# Patient Record
Sex: Female | Born: 1975 | Race: White | Hispanic: No | Marital: Married | Smoking: Never smoker
Health system: Southern US, Community
[De-identification: ages and names within clinical notes are randomized; demographics above are authoritative.]

## PROBLEM LIST (undated history)

## (undated) DIAGNOSIS — F419 Anxiety disorder, unspecified: Secondary | ICD-10-CM

## (undated) DIAGNOSIS — R7989 Other specified abnormal findings of blood chemistry: Secondary | ICD-10-CM

## (undated) DIAGNOSIS — E781 Pure hyperglyceridemia: Secondary | ICD-10-CM

## (undated) DIAGNOSIS — F32A Depression, unspecified: Secondary | ICD-10-CM

## (undated) DIAGNOSIS — F329 Major depressive disorder, single episode, unspecified: Secondary | ICD-10-CM

## (undated) HISTORY — DX: Depression, unspecified: F32.A

## (undated) HISTORY — DX: Pure hyperglyceridemia: E78.1

## (undated) HISTORY — DX: Other specified abnormal findings of blood chemistry: R79.89

## (undated) HISTORY — DX: Anxiety disorder, unspecified: F41.9

## (undated) HISTORY — DX: Major depressive disorder, single episode, unspecified: F32.9

---

## 2003-08-10 ENCOUNTER — Other Ambulatory Visit: Admission: RE | Admit: 2003-08-10 | Discharge: 2003-08-10 | Payer: Self-pay | Admitting: Obstetrics and Gynecology

## 2004-08-14 ENCOUNTER — Other Ambulatory Visit: Admission: RE | Admit: 2004-08-14 | Discharge: 2004-08-14 | Payer: Self-pay | Admitting: Obstetrics and Gynecology

## 2006-01-09 ENCOUNTER — Other Ambulatory Visit: Admission: RE | Admit: 2006-01-09 | Discharge: 2006-01-09 | Payer: Self-pay | Admitting: Obstetrics and Gynecology

## 2006-02-19 ENCOUNTER — Inpatient Hospital Stay (HOSPITAL_COMMUNITY): Admission: AD | Admit: 2006-02-19 | Discharge: 2006-02-23 | Payer: Self-pay | Admitting: Obstetrics and Gynecology

## 2006-02-26 ENCOUNTER — Inpatient Hospital Stay (HOSPITAL_COMMUNITY): Admission: AD | Admit: 2006-02-26 | Discharge: 2006-02-27 | Payer: Self-pay | Admitting: Obstetrics and Gynecology

## 2006-03-01 ENCOUNTER — Inpatient Hospital Stay (HOSPITAL_COMMUNITY): Admission: AD | Admit: 2006-03-01 | Discharge: 2006-03-01 | Payer: Self-pay | Admitting: Obstetrics and Gynecology

## 2008-10-27 ENCOUNTER — Inpatient Hospital Stay (HOSPITAL_COMMUNITY): Admission: AD | Admit: 2008-10-27 | Discharge: 2008-10-27 | Payer: Self-pay | Admitting: Obstetrics and Gynecology

## 2008-10-29 ENCOUNTER — Inpatient Hospital Stay (HOSPITAL_COMMUNITY): Admission: AD | Admit: 2008-10-29 | Discharge: 2008-11-01 | Payer: Self-pay | Admitting: Obstetrics and Gynecology

## 2010-12-18 LAB — CBC
HCT: 38.7 % (ref 36.0–46.0)
Hemoglobin: 12.9 g/dL (ref 12.0–15.0)
MCHC: 33.1 g/dL (ref 30.0–36.0)
MCHC: 33.2 g/dL (ref 30.0–36.0)
MCV: 95.1 fL (ref 78.0–100.0)
Platelets: 149 10*3/uL — ABNORMAL LOW (ref 150–400)
RDW: 14.1 % (ref 11.5–15.5)
WBC: 12 10*3/uL — ABNORMAL HIGH (ref 4.0–10.5)

## 2011-01-15 NOTE — Op Note (Signed)
Suzanne Rios, SIMAO               ACCOUNT NO.:  [id number]   MEDICAL RECORD NO.:  [id number]          PATIENT TYPE:  INP   LOCATION:  9148                          FACILITY:  [facility]   PHYSICIAN:  Duke Salvia. Marcelle Overlie, M.D.DATE OF BIRTH:  22-Apr-1976   DATE OF PROCEDURE:  DATE OF DISCHARGE:                               OPERATIVE REPORT   PREOPERATIVE DIAGNOSES:  Repeat cesarean section, spontaneous rupture of  membranes with labor, declines vaginal birth after cesarean.   POSTOPERATIVE DIAGNOSES:  Repeat cesarean section, spontaneous rupture  of membranes with labor, declines vaginal birth after cesarean, and  occiput posterior presentation.   PROCEDURE:  Repeat low transverse cesarean section.   SURGEON:  Duke Salvia. Marcelle Overlie, MD   ANESTHESIA:  Spinal.   COMPLICATIONS:  None.   DRAINS:  Foley catheter.   BLOOD LOSS:  700 mL.   PROCEDURE AND FINDINGS:  The patient was taken to the operating room.  After an adequate level of spinal anesthetic was obtained with the  patient in left tilt position, the abdomen was prepped and draped in the  usual manner for sterile abdominal procedures.  Foley catheter  positioned draining clear urine.  Transverse incision was made excising  the old scar in an ellipse.  This was carried down to the fascia, which  was incised and extended transversely.  Rectus muscle was divided in the  midline.  Peritoneum entered superiorly without incident and extended  vertically.  Bladder blade was positioned.  The vesicouterine serosa was  then incised.  The bladder flap was dissected below with sharp and blunt  dissection, and the bladder blade repositioned.  Transverse incision was  made in the lower segment, extended with blunt dissection.  The infant  was noted to be straight OP presentation, Apgars 9 and 9, weight 8  pounds 11 ounces.  A superficial nick on the baby's right cheek was made  with a scalpel on entry and was pointed out to pediatrics.  The  placenta  was delivered manually intact, the uterus exteriorized, cavity wiped  clean with a laparotomy pack, closure obtained with a first layer of 0  chromic in a locked fashion followed by an imbricating layer of 0  chromic.  Tubes and ovaries were normal.  The bladder flap area was  carefully inspected and noted to be intact and hemostatic.  Prior to  closure, sponge, needle, and instrument counts were reported as correct  x2.  Perineum was closed with a running 2-0 Vicryl suture.  Rectus  muscles reapproximated with 2-0 Vicryl interrupted sutures.  Fascia was  closed from laterally to midline on either side with 0 PDS suture.  Subcutaneous tissue was hemostatic after irrigation.  The skin closed  with staples and a sterile pressure dressing.  Clear urine was noted at  the end of the case.  She did receive Ancef 2 g IV preop and Pitocin  after the cord was clamped.  Mother and baby doing well at that point.      Richard M. Marcelle Overlie, M.D.  Electronically Signed     RMH/MEDQ  D:  10/29/2008  T:  10/29/2008  Job:  [id number]

## 2011-01-18 NOTE — Discharge Summary (Signed)
NAMEJORDY, Suzanne Rios               ACCOUNT NO.:  [id number]   MEDICAL RECORD NO.:  [id number]          PATIENT TYPE:  INP   LOCATION:  9148                          [facility]ACILITY:  [facility]   PHYSICIAN:  Dineen Kid. Rana Snare, M.D.    DATE OF BIRTH:  [birth year]   DATE OF ADMISSION:  10/29/2008  DATE OF DISCHARGE:  11/01/2008                               DISCHARGE SUMMARY   ADMITTING DIAGNOSES:  1. Intrauterine pregnancy at term.  2. Previous cesarean section, desires repeat.  3. Spontaneous rupture of membranes.   DISCHARGE DIAGNOSES:  1. Status post low transverse cesarean section.  2. Viable female infant.   PROCEDURE:  Repeat low transverse cesarean section.   REASON FOR ADMISSION:  Please see written H&P.   HOSPITAL COURSE:  The patient was a 35 year old gravida 2, para 1 that  was admitted to [facility] with spontaneous rupture of  membranes.  The patient was at term and had previously been scheduled  for cesarean delivery.  On admission, the patient was counseled  regarding possible vaginal birth after cesarean.  The patient declined.  Vital signs were stable with blood pressure 120/78.  Cervix was closed  with positive rupture of membranes.  The patient was then transferred to  the operating room where spinal anesthesia was administered without  difficulty.  A low-transverse incision was made with delivery of a  viable female infant weighing 8 pounds 11 ounces with Apgars of 8 at 1  minute and 9 at 5 minutes.  The patient tolerated the procedure well and  was taken to the recovery room in stable condition.  On postoperative  day #1, the patient was without complaints.  Vital signs were stable.  She was afebrile.  Abdomen was soft.  Fundus was firm and nontender.  Hemoglobin was noted to be 12.3.  On postoperative day #2, the patient  was without complaint.  Vital signs remained stable.  She was afebrile.  Abdomen was soft.  Fundus firm and nontender.  Incision was clean,  dry,  and intact.  On postoperative day #3, the patient stated that she did  not feel well.  She was achy with a productive cough.  Vital signs were  stable.  She was afebrile.  Lungs were clear to auscultation.  Abdomen  was soft.  Fundus was firm and nontender.  Incision was noted to have  some slight erythema noted superior to the incisional site with some  small oozing noted on the left margin of the incision.  Staples were  left in place.  The patient was started on some Ceftin 250 mg 1 p.o.  b.i.d. and was started on some Mucinex cough syrup later that afternoon.  The patient was feeling better and decided that she desired discharge.  Instructions were reviewed and the patient was later discharged home.   CONDITION ON DISCHARGE:  Stable.   DIET:  Regular as tolerated.   ACTIVITY:  No heavy lifting, no driving x2 weeks, no vaginal entry.   FOLLOWUP:  The patient to follow up in the office in 1 week for an  incision  check.  She is to call for temperature greater than 100  degrees, persistent nausea, vomiting, heavy vaginal bleeding, and/or  redness or further drainage from incisional site.   DISCHARGE MEDICATIONS:  1. Ceftin 250 mg 1 p.o. b.i.d. x10 days.  2. Mucinex cough syrup as directed.  3. Prenatal vitamins 1 p.o. daily.  4. Tylox #30 one p.o. every 4 hours as needed for pain.  5. Motrin 600 mg every 6 hours p.r.n.      [name], N.P.      Dineen Kid Rana Snare, M.D.  Electronically Signed    CC/MEDQ  D:  11/18/2008  T:  11/18/2008  Job:  [id number]

## 2011-01-18 NOTE — H&P (Signed)
NAMECOSETTE, PRINDLE               ACCOUNT NO.:  [id number]   MEDICAL RECORD NO.:  [id number]          PATIENT TYPE:  INP   LOCATION:  9173                          [facility]ILITY:  [facility]   PHYSICIAN:  Hal Morales, M.D.DATE OF BIRTH:  12-May-1976   DATE OF ADMISSION:  02/19/2006  DATE OF DISCHARGE:                                HISTORY & PHYSICAL   Ms. Suzanne Rios is a 35 year old gravida 1, para 0, at 41 weeks who presents for  induction secondary to post dates.   Dictation ends at this point.      Renaldo Reel Emilee Hero, C.N.M.      Hal Morales, M.D.  Electronically Signed    VLL/MEDQ  D:  02/19/2006  T:  02/19/2006  Job:  [id number]

## 2011-01-18 NOTE — H&P (Signed)
NAMEPATRICIA, Rios               ACCOUNT NO.:  [id number]   MEDICAL RECORD NO.:  [id number]          PATIENT TYPE:  INP   LOCATION:  9173                          [facility]   PHYSICIAN:  Hal Morales, M.D.DATE OF BIRTH:  31-Mar-1976   DATE OF ADMISSION:  02/19/2006  DATE OF DISCHARGE:                                HISTORY & PHYSICAL   Suzanne Rios is a 35 year old, gravida 1, para 0, at 41 weeks, who presents  today for induction secondary to post-dates.  Her pregnancy has been  remarkable for:   1.  History of depression and anxiety with patient well controlled on      Lexapro during her pregnancy.  2.  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   PRENATAL LABS:  Blood type is A+, RH antibody negative, VDRL nonreactive,  rubella titer positive, hepatitis B surface antigen negative, HIV  nonreactive, cystic fibrosis testing was negative.  Cultures were declined.  Pap was due after December.  This was done at 36 weeks and was normal.  Quadruple screen was declined.  Group B strep culture was negative at 36  weeks.  Glucola was normal at 120.  EDC of February 13, 2006, was established by  last menstrual period and was in agreement with ultrasound at approximately  12 and 18 weeks.   HISTORY OF PRESENT PREGNANCY:  Patient entered care at approximately 10  weeks.  She did have a nuchal translucency ultrasound at 12 weeks that was  within normal limits.  She declined quadruple screen.  She had another  ultrasound at 19 weeks, showing normal growth and development with placenta  anterior.  She was having some pain in her left side at 27 weeks.  This was  evaluated and found to be unremarkable.  Glucola was normal.  She was  continued with Lexapro 20 mg, during her pregnancy, q. day.  Her Pap and  group B strep culture was done at 36 weeks.  Pap was normal and group B  strep culture was negative.  She had an ultrasound at 37 weeks, showing  growth at  the 87th percentile, fluid was at the 95th percentile, fetus was  in a vertex presentation.  She was evaluated at 37 weeks for questionable  cramping.  Cervix was normal.  She was seen in the office on Monday of this  week.  Cervix was 1 cm, 50% with a vertex at a -1 station, slightly  posterior.  She was scheduled for induction secondary to post-dates.   OBSTETRICAL HISTORY:  Patient is a prima gravida.   MEDICAL HISTORY:  She was on Ortho Tri-Cyclen for 4 years, Yasmin for 3  years.  She had an abnormal Pap in 2005, but repeat was normal.  She reports  usual childhood illnesses.  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  She was  not treated, but x-rays have all been clear.  The last one was last year.  She had a couple of UTIs years ago.  Patient has depression and anxiety for  which she has been  on Lexapro with good control.   SURGICAL HISTORY:  She had wisdom teeth extracted in August 2006.  PATIENT  HAS NO KNOWN MEDICATION ALLERGIES.   FAMILY HISTORY:  Her maternal grandmother died of lung cancer.  Her paternal  grandfather died of colon cancer.  Genetic history is remarkable for the  father of the baby's maternal aunt and father of baby's paternal first  cousin having twins.   SOCIAL HISTORY:  Patient is married to the father of the baby.  He is  involved and supportive.  His name is J. Tereasa Coop.  The patient is Caucasian.  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  Her husband has a graduate degree  and is a Teacher, early years/pre.  She has been followed by the certified nurse midwife  service __________ OB.  She denies any alcohol, drug or tobacco use during  this pregnancy.   PHYSICAL EXAMINATION:  Vital signs are stable.  Patient is afebrile.  HEENT  is within normal limits.  Lungs were essentially clear.  Heart was regular  rate and rhythm without murmur.  Breasts were soft and nontender.  ABDOMEN:  Fundal height is approximately 39 cm.  Estimated fetal weight is 8  pounds.  Uterine  contractions are very 6 minutes, mild quality.  CERVICAL EXAM:  1 cm, slightly posterior, 70%, vertex and -1 station.  Fetal  heart rate is reactive with no decelerations.  EXTREMITIES:  Deep tendon reflexes are 2+ without clonus.  There is a trace  edema noted.   IMPRESSION:  1.  Intrauterine pregnancy at 41 weeks.  2.  Negative beta strep.  3.  Post-dates.   PLAN:  1.  Admit to birthing suite for consult with Dr. Dierdre Forth as      attending physician.  2.  Routine certified nurse midwife orders.  3.  Will plan Cervidil placement and will reevaluate in the morning for plan      of care regarding leaving the Cervidil in for a full 12 hours or      removing it in the morning and starting Pitocin.  4.  Risks and benefits of induction were reviewed with the patient,      including failure of method, prolonged labor, and need for cesarean      section.  Patient and her husband seem to understand these risks, and do      wish to proceed.      Renaldo Reel Emilee Hero, C.N.M.      Hal Morales, M.D.  Electronically Signed    VLL/MEDQ  D:  02/19/2006  T:  02/20/2006  Job:  [id number]

## 2011-01-18 NOTE — Discharge Summary (Signed)
NAMELILLYONA, Suzanne Rios               ACCOUNT NO.:  [id number]   MEDICAL RECORD NO.:  [id number]          PATIENT TYPE:  INP   LOCATION:  9108                          [facility]  [facility]   PHYSICIAN:  Osborn Coho, M.D.   DATE OF BIRTH:  03-25-1976   DATE OF ADMISSION:  02/19/2006  DATE OF DISCHARGE:  02/23/2006                                 DISCHARGE SUMMARY   ADMITTING DIAGNOSES:  1.  Intrauterine pregnancy at term.  2.  Failure to progress.   PROCEDURE:  Primary low transverse cesarean section.   POSTOPERATIVE DIAGNOSES:  1.  Intrauterine pregnancy at term, delivered.  2.  Failure to progress.  3.  Primary low transverse cesarean section.   Suzanne Rios is a 35 year old gravida 1, para 0, at 41 weeks, who presented  for induction of labor. She progressed to complete and pushed for 2-1/2  hours with no further descend. Dr. Normand Sloop assessed the patient and  discussed options with the patient and husband regarding plan for delivery.  The patient opted for primary low transverse cesarean section.  This was  accomplished on February 21, 2006, by Dr. Jaymes Graff with the birth of an 8  pound 3 ounce female infant with Apgar scores of 9 at one 1 minute and 9 at  five minutes. Both the patient and infant have done well in the  postoperative period. The patient's vital signs have been stable.  She is  afebrile.  Her hemoglobin on the first postoperative day at 10.8. On this,  her third postoperative day, her incision is clean, dry and intact.  Her  Jackson-Pratt drain was removed easily without any problems.  On this, her  third postoperative day, she is judged to be in satisfactory condition for  discharge.  Discharge instructions per [facility] handout.   DISCHARGE MEDICATIONS:  1.  Motrin 600 mg p.o. q.6 h p.r.n. pain.  2.  Tylox one to two p.o. q. 3-4 hours p.r.n. pain.  3.  Prenatal vitamins.   DISCHARGE FOLLOWUP:  Will be in [facility] in 6 weeks.      Rica Koyanagi, C.N.M.      Osborn Coho, M.D.  Electronically Signed   SDM/MEDQ  D:  02/23/2006  T:  02/23/2006  Job:  [id number]

## 2011-01-18 NOTE — Op Note (Signed)
Suzanne Rios, Suzanne Rios               ACCOUNT NO.:  [id number]   MEDICAL RECORD NO.:  [id number]          PATIENT TYPE:  INP   LOCATION:  9108                          [facility]   PHYSICIAN:  Naima A. Dillard, M.D. DATE OF BIRTH:  Nov 25, 1975   DATE OF PROCEDURE:  02/21/2006  DATE OF DISCHARGE:                                 OPERATIVE REPORT   PREOPERATIVE DIAGNOSES:  1.  Intrauterine pregnancy at term.  2.  Failure to descend.   POSTOPERATIVE DIAGNOSES:  1.  Intrauterine pregnancy at term.  2.  Failure to descend.   PROCEDURE:  Primary low transverse cesarean section with two layer closure.   SURGEON:  Naima A. Normand Sloop, MD.   ASSISTANT:  Rhona Leavens, CNM.   ANESTHESIA:  Epidural.   IV FLUIDS:  1800 mL crystalloid.   URINE OUTPUT:  200 mL clear urine at the end of the procedure.   ESTIMATED BLOOD LOSS:  800 mL.   COMPLICATIONS:  None.   FINDINGS:  Female infant with Apgar of 9 and 9. There was clear fluid and  nuchal cord x1, normal abdominal and pelvic anatomy.   DESCRIPTION OF PROCEDURE:  The patient was taken to the operating room where  she was prepped and draped in the normal sterile fashion. A Foley catheter  had already been in place and the urine was very concentrated and a little  blood tinged just from the patient pushing. The patient was prepped and  draped in a normal sterile fashion. Her epidural anesthesia was found to be  adequate. She was in dorsal supine position with a left lateral tilt. A  Pfannenstiel skin incision was made with the scalpel and carried down to the  fascia. The fascia was incised in the midline with Bovie and extended  bilaterally using Mayo's, a Kocher and pickups with teeth. Kochers x2 were  placed in the superior aspect of the fascia which was dissected off the  rectus muscles both sharply and bluntly. The inferior aspect of the fascia  was dissected off the rectus muscles in a similar fashion. The rectus  muscles were  separated in the midline, the peritoneum was identified, tinted  up and entered sharply with Metzenbaum scissors and extended superiorly and  inferiorly with good visualization of bowel and bladder. The bladder blade  was inserted, the vesicouterine peritoneum was identified, tented up and  entered sharply and extended bilaterally with bladder flap created  digitally. The bladder blade was reinserted, a primary lower transverse  uterine incision was made with a scalpel and extended bilaterally bluntly.  The infant's head was sucked well way into the pelvis. After moving the head  out of the pelvis, the head was delivered without difficulty. Mouth and nose  were bulb suctioned, nuchal cord x1 was easily reduced. The body was  delivered without difficulty and cord was clamped and cut and handed over to  the waiting pediatrician. Cord blood was obtained, the placenta was  delivered manually. The uterus was cleared of all clot and debris. The  uterine incision was repaired with #0 Vicryl in a running  locked fashion. A  second layer of #0 Vicryl was used to imbricate the incision on the uterus  in a running fashion. Irrigation was done, hemostasis was assured, patient  had normal abdominal and pelvic anatomy. The patient then was closed with #0  chromic. The rectus muscles remained hemostatic using Bovie cautery and a  stitch in the left upper quadrant which had been bleeding. Hemostasis was  assured. The fascia was closed with #0 Vicryl in a running fashion. The  subcutaneous layer was irrigated and made hemostatic with Bovie cautery. A  size 10 JP drain was placed into the subcutaneous tissue. The subcutaneous  tissue was reapproximated with 2-0 plain in interrupted fashion. The skin  was closed with Monocryl in subcuticular fashion. Steri-Strips were applied.  Sponge, lap and needle counts were correct. The patient went to the recovery  room in good condition.      Naima A. Normand Sloop,  M.D.  Electronically Signed     NAD/MEDQ  D:  02/21/2006  T:  02/21/2006  Job:  [id number]

## 2014-08-03 ENCOUNTER — Encounter: Payer: Self-pay | Admitting: Obstetrics and Gynecology

## 2014-08-03 ENCOUNTER — Ambulatory Visit (INDEPENDENT_AMBULATORY_CARE_PROVIDER_SITE_OTHER): Payer: BC Managed Care – PPO | Admitting: Obstetrics and Gynecology

## 2014-08-03 VITALS — BP 110/70 | HR 76 | Resp 16 | Ht 64.0 in | Wt 207.8 lb

## 2014-08-03 DIAGNOSIS — Z0000 Encounter for general adult medical examination without abnormal findings: Secondary | ICD-10-CM

## 2014-08-03 DIAGNOSIS — Z01419 Encounter for gynecological examination (general) (routine) without abnormal findings: Secondary | ICD-10-CM

## 2014-08-03 DIAGNOSIS — Z3009 Encounter for other general counseling and advice on contraception: Secondary | ICD-10-CM

## 2014-08-03 LAB — POCT URINALYSIS DIPSTICK
BILIRUBIN UA: NEGATIVE
GLUCOSE UA: NEGATIVE
KETONES UA: NEGATIVE
Leukocytes, UA: NEGATIVE
Nitrite, UA: NEGATIVE
Protein, UA: NEGATIVE
RBC UA: NEGATIVE
Urobilinogen, UA: NEGATIVE
pH, UA: 5

## 2014-08-03 LAB — HEMOGLOBIN, FINGERSTICK: HEMOGLOBIN, FINGERSTICK: 13.2 g/dL (ref 12.0–16.0)

## 2014-08-03 MED ORDER — NORETHINDRONE 0.35 MG PO TABS: 1.0000 | ORAL_TABLET | Freq: Every day | ORAL | Status: DC

## 2014-08-03 MED ORDER — SERTRALINE HCL 50 MG PO TABS: ORAL_TABLET | ORAL | Status: DC

## 2014-08-03 MED ORDER — BUPROPION HCL ER (XL) 150 MG PO TB24: 150.0000 mg | ORAL_TABLET | Freq: Every day | ORAL | Status: DC

## 2014-08-03 NOTE — Progress Notes (Signed)
Patient ID: Suzanne Rios, female   DOB: Sep 18, 1975, 38 y.o.   MRN: [id number] 38 y.o. G2P2 MarriedCaucasianF here for annual exam.    Needs refill on oral contraceptive.  Helps with dysmenorrhea and PMS but cycles are regular.   Patient is taking Zoloft and Wellbutrin for anxiety and depression and would like refills.  Has been taking these for a long time for mood swings.  No suicidal ideation or treatment in hospital for anxiety/depression.   Some weight gain.   PCP:  Suzanne RowanElizabeth Dewey, MD  Patient's last menstrual period was 07/27/2014 (exact date).          Sexually active: Yes.  female  The current method of family planning is OCP (estrogen/progesterone).[name]    Exercising: Yes.    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 Smoker:  no  Health Maintenance: Pap:  12/2012 WGN:FAOZHYwnl:unsure of HPV testing History of abnormal Pap:  Yes, had abnormal pap 10 yrs ago and only had repeat pap which returned normal--no colposcopy, no treatment to cervix. MMG:  --- Colonoscopy:  --- BMD:   --- TDaP:  2014 Screening Labs:  Hb today: 13.2, Urine today: Neg   reports that she has never smoked. She does not have any smokeless tobacco history on file. She reports that she drinks about 1.2 oz of alcohol per week. She reports that she does not use illicit drugs.  Past Medical History  Diagnosis Date  . Anxiety   . Depression     Past Surgical History  Procedure Laterality Date  . Cesarean section  2007, 2010    Current Outpatient Prescriptions  Medication Sig Dispense Refill  . cetirizine (ZYRTEC) 10 MG tablet Take 10 mg by mouth daily.    Marland Kitchen. buPROPion (WELLBUTRIN XL) 150 MG 24 hr tablet Take 150 mg by mouth daily.  3  . [name] 0.35 MG tablet Take 1 tablet by mouth daily. Takes continuously  0  . fluticasone (FLONASE) 50 MCG/ACT nasal spray Place 1 spray into both nostrils as needed.  1  . sertraline (ZOLOFT) 25 MG tablet Take  25 mg by mouth every evening.  3  . sertraline (ZOLOFT) 50 MG tablet Take 100 mg by mouth daily.  1   No current facility-administered medications for this visit.    Family History  Problem Relation Age of Onset  . Thyroid disease Sister   . Hyperlipidemia Brother   . Cancer Father [id number]    Prostate CA    ROS:  Pertinent items are noted in HPI.  Otherwise, a comprehensive ROS was negative.  Exam:   BP 110/70 mmHg  Pulse 76  Resp 16  Ht 5\' 4"  (1.626 m)  Wt 207 lb 12.8 oz (94.257 kg)  BMI 35.65 kg/m2  LMP 07/27/2014 (Exact Date)    Height: 5\' 4"  (162.6 cm)  Ht Readings from Last 3 Encounters:  08/03/14 5\' 4"  (1.626 m)    General appearance: alert, cooperative and appears stated age Head: Normocephalic, without obvious abnormality, atraumatic Neck: no adenopathy, supple, symmetrical, trachea midline and thyroid normal to inspection and palpation Lungs: clear to auscultation bilaterally Breasts: normal appearance, no masses or tenderness, Inspection negative, No nipple retraction or dimpling, No nipple discharge or bleeding, No axillary or supraclavicular adenopathy Heart: regular rate and rhythm Abdomen: soft, non-tender; bowel sounds normal; no masses,  no organomegaly Extremities: extremities normal, atraumatic, no cyanosis or edema Skin: Skin color, texture, turgor normal. No rashes  or lesions Lymph nodes: Cervical, supraclavicular, and axillary nodes normal. No abnormal inguinal nodes palpated Neurologic: Grossly normal   Pelvic: External genitalia:  no lesions              Urethra:  normal appearing urethra with no masses, tenderness or lesions              Bartholins and Skenes: normal                 Vagina: normal appearing vagina with normal color and discharge, no lesions              Cervix: no lesions              Pap taken: Yes.   Bimanual Exam:  Uterus:  normal and normal size, contour, position, consistency, mobility, non-tender              Adnexa: normal  adnexa and no mass, fullness, tenderness              A:  Well Woman with normal exam History of Cesarean Section x 2.  Irregular menses on progesterone only OCP. Remote history of abnormal pap.  Depression and anxiety.   P:   Mammogram at age 38. pap smear and HR HPV.  Discussion of contraception options - combined OCPs, NuvaRing, Mirena IUD.  Patient interested to pursue Mirena IUD.  Refill of Zoloft 75 mg in am and 50 mg at hs.  Refill of Wellbutrin XL.  See orders. Routine labs and TSH.  return annually or prn  An After Visit Summary was printed and given to the patient.

## 2014-08-03 NOTE — Patient Instructions (Signed)

## 2014-08-04 LAB — CBC
HCT: 38.5 % (ref 36.0–46.0)
Hemoglobin: 12.9 g/dL (ref 12.0–15.0)
MCH: 29.8 pg (ref 26.0–34.0)
MCHC: 33.5 g/dL (ref 30.0–36.0)
MCV: 88.9 fL (ref 78.0–100.0)
MPV: 9.5 fL (ref 9.4–12.4)
PLATELETS: 351 10*3/uL (ref 150–400)
RBC: 4.33 MIL/uL (ref 3.87–5.11)
RDW: 14.2 % (ref 11.5–15.5)
WBC: 7.6 10*3/uL (ref 4.0–10.5)

## 2014-08-04 LAB — COMPREHENSIVE METABOLIC PANEL
ALT: 20 U/L (ref 0–35)
AST: 20 U/L (ref 0–37)
Albumin: 4.4 g/dL (ref 3.5–5.2)
Alkaline Phosphatase: 68 U/L (ref 39–117)
BILIRUBIN TOTAL: 0.4 mg/dL (ref 0.2–1.2)
BUN: 17 mg/dL (ref 6–23)
CALCIUM: 9.2 mg/dL (ref 8.4–10.5)
CHLORIDE: 105 meq/L (ref 96–112)
CO2: 25 mEq/L (ref 19–32)
CREATININE: 0.97 mg/dL (ref 0.50–1.10)
Glucose, Bld: 79 mg/dL (ref 70–99)
Potassium: 4.2 mEq/L (ref 3.5–5.3)
Sodium: 139 mEq/L (ref 135–145)
Total Protein: 7.1 g/dL (ref 6.0–8.3)

## 2014-08-04 LAB — LIPID PANEL
CHOL/HDL RATIO: 5 ratio
CHOLESTEROL: 171 mg/dL (ref 0–200)
HDL: 34 mg/dL — AB (ref >39)
LDL Cholesterol: 93 mg/dL (ref 0–99)
TRIGLYCERIDES: 221 mg/dL — AB (ref <150)
VLDL: 44 mg/dL — AB (ref 0–40)

## 2014-08-04 LAB — TSH: TSH: 2.112 u[IU]/mL (ref 0.350–4.500)

## 2014-08-09 LAB — IPS PAP TEST WITH HPV

## 2014-08-23 ENCOUNTER — Telehealth: Payer: Self-pay | Admitting: Obstetrics and Gynecology

## 2014-08-23 NOTE — Telephone Encounter (Signed)
Left message for patient to call back. Need to go over IUD benefit.  Pr $0

## 2014-08-29 ENCOUNTER — Other Ambulatory Visit: Payer: Self-pay | Admitting: Obstetrics and Gynecology

## 2014-08-29 NOTE — Telephone Encounter (Signed)
Medication refill request: [name] 0.35 mg Last AEX:  08/03/14 with Dr. Edward JollySilva Next AEX: 08/10/15 with Dr. Edward JollySilva  Last MMG (if hormonal medication request): N/A  Refill authorized: Please advise according to AEX note patient was interested in Mirena IUD.

## 2015-06-24 ENCOUNTER — Other Ambulatory Visit: Payer: Self-pay | Admitting: Obstetrics and Gynecology

## 2015-06-26 ENCOUNTER — Other Ambulatory Visit: Payer: Self-pay | Admitting: Obstetrics and Gynecology

## 2015-06-26 NOTE — Telephone Encounter (Signed)
Medication refill request: Wellbutrin 150 mg Last AEX:  08/03/14 Dr. Edward JollySilva Next AEX: 08/10/15 Dr. Edward JollySilva  Last MMG (if hormonal medication request): none Refill authorized: 08/03/14 #90tabs/ 3R. Today #30/0R?  "This prescription was filled today(06/26/2015). Any refills authorized will be placed on file." - Pharmacy

## 2015-06-26 NOTE — Telephone Encounter (Signed)
Rx for Wellbutrin 150 mg #30 1RF sent to pharmacy on file. Left message for patient to call Kaitlyn at [phone].

## 2015-06-26 NOTE — Telephone Encounter (Signed)
Medication refill request: OCP Last AEX:  08/03/14  Next AEX: 08/10/15 Dr. Edward JollySilva Last MMG (if hormonal medication request): none Refill authorized: 08/29/14 #28tabs/10R. Today #1pack/1R?

## 2015-06-26 NOTE — Telephone Encounter (Signed)
Approve Wellbutrin 150 mg, #30, 1 refill to get her through to her annual exam.

## 2015-06-27 ENCOUNTER — Other Ambulatory Visit: Payer: Self-pay | Admitting: Obstetrics and Gynecology

## 2015-06-27 NOTE — Telephone Encounter (Signed)
Medication refill request: Zoloft Last AEX:  08-03-14 Next AEX: 08-10-15 Last MMG (if hormonal medication request): N/A Refill authorized: please advise  (sending to Dr. Hyacinth MeekerMiller since Dr. Edward JollySilva is off)

## 2015-07-25 ENCOUNTER — Other Ambulatory Visit: Payer: Self-pay | Admitting: Obstetrics and Gynecology

## 2015-07-25 NOTE — Telephone Encounter (Signed)
Medication refill request: Setraline HCL 50 mg  Last AEX:  08/03/2014  Silva Next AEX: 08/10/2015  Silva Last MMG (if hormonal medication request): None Refill authorized: Setraline HCL #225 3 Refills Today: #30 tabs 0 Refills?

## 2015-08-10 ENCOUNTER — Encounter: Payer: Self-pay | Admitting: Obstetrics and Gynecology

## 2015-08-10 ENCOUNTER — Ambulatory Visit: Payer: BC Managed Care – PPO | Admitting: Obstetrics and Gynecology

## 2015-09-07 ENCOUNTER — Ambulatory Visit (INDEPENDENT_AMBULATORY_CARE_PROVIDER_SITE_OTHER): Payer: 59 | Admitting: Obstetrics and Gynecology

## 2015-09-07 ENCOUNTER — Telehealth: Payer: Self-pay | Admitting: Obstetrics and Gynecology

## 2015-09-07 ENCOUNTER — Encounter: Payer: Self-pay | Admitting: Obstetrics and Gynecology

## 2015-09-07 VITALS — BP 110/74 | HR 72 | Ht 64.5 in | Wt 210.0 lb

## 2015-09-07 DIAGNOSIS — Z01419 Encounter for gynecological examination (general) (routine) without abnormal findings: Secondary | ICD-10-CM | POA: Diagnosis not present

## 2015-09-07 DIAGNOSIS — N949 Unspecified condition associated with female genital organs and menstrual cycle: Secondary | ICD-10-CM

## 2015-09-07 DIAGNOSIS — R102 Pelvic and perineal pain: Secondary | ICD-10-CM

## 2015-09-07 DIAGNOSIS — N926 Irregular menstruation, unspecified: Secondary | ICD-10-CM | POA: Diagnosis not present

## 2015-09-07 DIAGNOSIS — Z0000 Encounter for general adult medical examination without abnormal findings: Secondary | ICD-10-CM

## 2015-09-07 LAB — CBC
HCT: 41.4 % (ref 36.0–46.0)
HEMOGLOBIN: 13.9 g/dL (ref 12.0–15.0)
MCH: 30.3 pg (ref 26.0–34.0)
MCHC: 33.6 g/dL (ref 30.0–36.0)
MCV: 90.2 fL (ref 78.0–100.0)
MPV: 10 fL (ref 8.6–12.4)
PLATELETS: 266 10*3/uL (ref 150–400)
RBC: 4.59 MIL/uL (ref 3.87–5.11)
RDW: 14.5 % (ref 11.5–15.5)
WBC: 5.7 10*3/uL (ref 4.0–10.5)

## 2015-09-07 LAB — POCT URINALYSIS DIPSTICK
BILIRUBIN UA: NEGATIVE
Blood, UA: NEGATIVE
Glucose, UA: NEGATIVE
KETONES UA: NEGATIVE
Leukocytes, UA: NEGATIVE
Nitrite, UA: NEGATIVE
PH UA: 5
PROTEIN UA: NEGATIVE
Urobilinogen, UA: NEGATIVE

## 2015-09-07 LAB — TSH: TSH: 4.469 u[IU]/mL (ref 0.350–4.500)

## 2015-09-07 LAB — POCT URINE PREGNANCY: Preg Test, Ur: NEGATIVE

## 2015-09-07 LAB — HEMOGLOBIN, FINGERSTICK: HEMOGLOBIN, FINGERSTICK: 14.2 g/dL (ref 12.0–16.0)

## 2015-09-07 MED ORDER — MEDROXYPROGESTERONE ACETATE 10 MG PO TABS: 10.0000 mg | ORAL_TABLET | Freq: Every day | ORAL | Status: DC

## 2015-09-07 NOTE — Telephone Encounter (Signed)
Patient was seen today and prescribed a medication and she has some questions about it.

## 2015-09-07 NOTE — Addendum Note (Signed)
Addended by: Maceo ProGONZALEZ, Jamian Andujo C on: 09/07/2015 09:28 AM   Modules accepted: Orders

## 2015-09-07 NOTE — Patient Instructions (Signed)

## 2015-09-07 NOTE — Telephone Encounter (Signed)
Spoke with patient. Patient states when she was seen this morning she was prescribed Provera to help start her cycle. Patient would like to know if she starts the Provera and starts having bleeding before the 10 days is up if she continues the medication or if she should stop when she starts bleeding. Advised I will clarify with Dr.Silva and return call with further information. Patient is agreeable.

## 2015-09-07 NOTE — Progress Notes (Signed)
Patient ID: Suzanne Rios, female   DOB: [birth year], 40 y.o.   MRN: [id number]  40 y.o. G25P0002 Married Caucasian female here for annual exam.    Wants routine labs.   Having cramps this month but no menses. UPT negative at home. Stopped Progesterone only OCPs at the end of October because husband had vasectomy and patient was not feeling well - felt bloated all the time, cramping. Had menses in November, and this was ok.  Occurred one month later.  Now having cramping for 3 weeks.   No migraines with aura, HTN, or hx of blood clots.  Unsure why she was on progesterone only OCPS in past.   Daughter has type I diabetes.   PCP:  Maryelizabeth Rowan, MD  Patient's last menstrual period was 07/18/2015 (exact date).          Sexually active: Yes.    The current method of family planning is vasectomy.    Exercising: Yes.    walking Smoker:  no  Health Maintenance: Pap:08/03/14, Negative with neg HR HPV History of abnormal Pap: Yes, had abnormal pap 10 yrs ago and only had repeat pap which returned normal--no colposcopy, no treatment to cervix. MMG: --- Colonoscopy: --- BMD: --- TDaP: 2014 Screening Labs: Hb today: 14.2, Urine today: Negative   reports that she has never smoked. She has never used smokeless tobacco. She reports that she drinks about 1.2 oz of alcohol per week. She reports that she does not use illicit drugs.  Past Medical History  Diagnosis Date  . Anxiety   . Depression     Past Surgical History  Procedure Laterality Date  . Cesarean section  2007, 2010    Current Outpatient Prescriptions  Medication Sig Dispense Refill  . buPROPion (WELLBUTRIN XL) 150 MG 24 hr tablet TAKE ONE TABLET BY MOUTH EVERY DAY 30 tablet 1  . sertraline (ZOLOFT) 50 MG tablet Take 2 and 1/2 tabs (125mg ) at bedtime.     No current facility-administered medications for this visit.    Family History  Problem Relation Age of Onset  . Thyroid disease Sister   . Hyperlipidemia  Brother   . Cancer Father 82    Prostate CA    ROS:  Pertinent items are noted in HPI.  Otherwise, a comprehensive ROS was negative.  Exam:   BP 110/74 mmHg  Pulse 72  Ht 5' 4.5" (1.638 m)  Wt 210 lb (95.255 kg)  BMI 35.50 kg/m2  LMP 07/18/2015 (Exact Date)    General appearance: alert, cooperative and appears stated age Head: Normocephalic, without obvious abnormality, atraumatic Neck: no adenopathy, supple, symmetrical, trachea midline and thyroid normal to inspection and palpation Lungs: clear to auscultation bilaterally Breasts: normal appearance, no masses or tenderness, Inspection negative, No nipple retraction or dimpling, No nipple discharge or bleeding, No axillary or supraclavicular adenopathy Heart: regular rate and rhythm Abdomen: soft, non-tender; bowel sounds normal; no masses,  no organomegaly Extremities: extremities normal, atraumatic, no cyanosis or edema Skin: Skin color, texture, turgor normal. No rashes or lesions Lymph nodes: Cervical, supraclavicular, and axillary nodes normal. No abnormal inguinal nodes palpated Neurologic: Grossly normal  Pelvic: External genitalia:  no lesions              Urethra:  normal appearing urethra with no masses, tenderness or lesions              Bartholins and Skenes: normal  Vagina: normal appearing vagina with normal color and discharge, no lesions              Cervix: no lesions              Pap taken: No. Bimanual Exam:  Uterus:  normal size, contour, position, consistency, mobility, non-tender              Adnexa: normal adnexa and no mass, fullness, tenderness              Rectovaginal: No..    Chaperone was present for exam.  Assessment:   Well woman visit with normal exam. Irregular menses.  Pelvic cramping.   Plan: Yearly mammogram recommended after age 39.  Recommended self breast exam.  Pap and HR HPV as above. Discussed Calcium, Vitamin D, regular exercise program including cardiovascular  and weight bearing exercise. Labs performed.  Yes.  .   See orders. Refills given on medications.  No..    Follow up annually and prn.   Additional counseling given  Regarding irregular menses. 10 minutes spent of which over 50% was spent in counseling.g. Check TSH and prolactin.  Will do UPT now and then Provera challenge.  See orders.  Discussed side effects and function of Provera.  Discussed combined low dose combined OCPs if cycles are continuing to be irregular, i.e. Skipping. Encouraged weight loss.  If cramping persists, pelvic ultrasound.   After visit summary provided.

## 2015-09-07 NOTE — Telephone Encounter (Signed)
I recommend taking the entire Provera prescription for the full 10 days.

## 2015-09-08 LAB — COMPREHENSIVE METABOLIC PANEL
ALBUMIN: 4.8 g/dL (ref 3.6–5.1)
ALT: 24 U/L (ref 6–29)
AST: 19 U/L (ref 10–30)
Alkaline Phosphatase: 52 U/L (ref 33–115)
BILIRUBIN TOTAL: 0.9 mg/dL (ref 0.2–1.2)
BUN: 18 mg/dL (ref 7–25)
CHLORIDE: 104 mmol/L (ref 98–110)
CO2: 22 mmol/L (ref 20–31)
CREATININE: 0.95 mg/dL (ref 0.50–1.10)
Calcium: 9.6 mg/dL (ref 8.6–10.2)
GLUCOSE: 96 mg/dL (ref 65–99)
Potassium: 4.5 mmol/L (ref 3.5–5.3)
SODIUM: 139 mmol/L (ref 135–146)
Total Protein: 7.3 g/dL (ref 6.1–8.1)

## 2015-09-08 LAB — LIPID PANEL
CHOL/HDL RATIO: 4.1 ratio (ref <=5.0)
Cholesterol: 174 mg/dL (ref 125–200)
HDL: 42 mg/dL — ABNORMAL LOW (ref >=46)
LDL CALC: 112 mg/dL (ref <130)
Triglycerides: 102 mg/dL (ref <150)
VLDL: 20 mg/dL (ref <30)

## 2015-09-08 LAB — PROLACTIN: PROLACTIN: 8 ng/mL

## 2015-09-08 NOTE — Telephone Encounter (Signed)
Spoke with patient. Advised of message as seen below from Dr.Silva. Patient is agreeable.  Routing to provider for final review. Patient agreeable to disposition. Will close encounter.  

## 2015-09-11 ENCOUNTER — Telehealth: Payer: Self-pay

## 2015-09-11 NOTE — Telephone Encounter (Signed)
Spoke with patient. Advised of message and results as seen below form Dr.Silva. Patient is agreeable and verbalizes understanding.  Routing to provider for final review. Patient agreeable to disposition. Will close encounter.  

## 2015-09-11 NOTE — Telephone Encounter (Signed)
-----   Message from Patton SallesBrook E Amundson C Silva, MD sent at 09/09/2015  2:33 PM EST ----- Please report labs to patient Normal TSH and prolactin.  Normal CBC, CMP. Low HDL cholesterol.  Ratios overall look ok. I do recommend a low cholesterol diet, increased exercise and reduction in weight to lower cardiovascular disease risk. I know these are her goals for this year, and I support her in this!  Cc - Claudette LawsAmanda Dixon

## 2016-01-16 ENCOUNTER — Telehealth: Payer: Self-pay | Admitting: Obstetrics and Gynecology

## 2016-01-16 NOTE — Telephone Encounter (Signed)
Medication refill request: Zoloft 50 mg Last AEX:  09/07/2015 with BS  Next AEX: 10/03/2016 with BS  Last MMG (if hormonal medication request): n/a Refill authorized: Please advise

## 2016-01-16 NOTE — Telephone Encounter (Signed)
Patient calling requesting refills on her Zoloft to last until her next AEX. She said she forgot to ask for refills at her last visit. Pharmacy on file is correct.

## 2016-01-18 ENCOUNTER — Other Ambulatory Visit: Payer: Self-pay | Admitting: Obstetrics and Gynecology

## 2016-01-18 ENCOUNTER — Telehealth: Payer: Self-pay | Admitting: Obstetrics and Gynecology

## 2016-01-18 MED ORDER — SERTRALINE HCL 50 MG PO TABS: 50.0000 mg | ORAL_TABLET | Freq: Every day | ORAL | Status: DC

## 2016-01-18 NOTE — Telephone Encounter (Signed)
Call to patient and she is advised that refills were placed by Dr. Edward [facility]Sil[facility] to [facility] [address].  Patient agreeable and is very thankful for Dr. Rica RecordsSilva's assistance.   Routing to provider for final review. Patient agreeable to disposition. Will close encounter.

## 2016-01-18 NOTE — Telephone Encounter (Signed)
Patient's pharmacy calling requesting clarification on prescription for Zoloft 50 mg.

## 2016-01-18 NOTE — Telephone Encounter (Signed)
Refills of Zoloft sent to pharmacy of record by me now. Zoloft 125 mg daily.  Dispense: # 225, RF: 3.

## 2016-01-18 NOTE — Telephone Encounter (Signed)
[facility] Advised patient is to take 2 and 1/2 tablets of Zoloft 50 mg (125 mg total) at bedtime daily. Pharmacy is agreeable and will dispense rx at this time.   Patton SallesBrook E Amundson C Silva, MD at 01/18/2016 2:17 PM     Status: Signed       Expand All Collapse All   Refills of Zoloft sent to pharmacy of record by me now. Zoloft 125 mg daily. Dispense: # 225, RF: 3.       Routing to provider for final review. Patient agreeable to disposition. Will close encounter.

## 2016-06-18 ENCOUNTER — Telehealth: Payer: Self-pay | Admitting: Obstetrics and Gynecology

## 2016-06-18 MED ORDER — MEDROXYPROGESTERONE ACETATE 10 MG PO TABS: 10.0000 mg | ORAL_TABLET | Freq: Every day | ORAL | 0 refills | Status: DC

## 2016-06-18 NOTE — Telephone Encounter (Signed)
Patient wants to speak with the nurse. No information given. °

## 2016-06-18 NOTE — Telephone Encounter (Signed)
Ok for Provera 10 mg po x 10 days.  Please send to pharmacy of choice. Not unreasonable to do a UPT first.  Vasectomy can fail....Marland Kitchen..Marland Kitchen

## 2016-06-18 NOTE — Telephone Encounter (Signed)
Spoke with patient. Advised of message as seen below from Dr.Silva. Patient is agreeable and verbalizes understanding. Rx for Provera 10 mg x 10 days #10 0RF sent to pharmacy on file. Aware of recommendations to take UPT before starting Provera. Patient is agreeable.  Routing to provider for final review. Patient agreeable to disposition. Will close encounter.

## 2016-06-18 NOTE — Telephone Encounter (Signed)
Spoke with patient. Patient states that when she was seen for her annual exam in January 2017 she was prescribed Provera because she was having weeks of cramping without starting her menses. Reports after taking the Provera her menses started and has been regular since. States she had a menses in September, unsure of the exact date, but has not started a menses this month. Reports she has been experiencing two weeks of moderate cramping and has not started her menses. "It has been over 28 days for sure. There is no chance I am pregnant." Patient's husband has had a vasectomy. Asking if she will need to restart on Provera at this time. Advised I will send a message to Dr.Silva and covering provider and return call with further recommendations. Patient is agreeable.

## 2016-10-03 ENCOUNTER — Ambulatory Visit: Payer: 59 | Admitting: Obstetrics and Gynecology

## 2017-01-01 NOTE — Progress Notes (Signed)
41 y.o. G66P0002 Married Caucasian female here for annual exam.    On Zoloft 125 mg for anxiety and depression. Wants to up the dosage for the week of her PMS. Now is off the Wellbutrin as she did not have increased energy with this addition to her regimen.  Lost 16 pounds with Weight Watchers and exercise.  Can have pain and bloating with her cycles.  No irregular bleeding.   PCP:   ----  Patient's last menstrual period was 12/15/2016.     Period Cycle (Days): 28 Period Duration (Days): 6 Period Pattern: Regular Menstrual Flow: Heavy, Light Menstrual Control: Tampon Dysmenorrhea: (!) Severe Dysmenorrhea Symptoms: Cramping     Sexually active: Yes.    The current method of family planning is vasectomy.    Exercising: Yes.    [employer] Smoker:  no  Health Maintenance: Pap: 08-03-14 Neg:Neg HR HPV History of abnormal Pap:  Yes, 2007 abnormal pap, no colposcopy, no treatment--repeat pap normal. MMG:  Never Colonoscopy:  Never BMD:   Never  Result  N/A TDaP:  2014 Gardasil:   no HIV: Completed  Screening Labs: will do today.   reports that she has never smoked. She has never used smokeless tobacco. She reports that she drinks about 1.2 oz of alcohol per week . She reports that she does not use drugs.  Past Medical History:  Diagnosis Date  . Anxiety   . Depression     Past Surgical History:  Procedure Laterality Date  . CESAREAN SECTION  2007, 2010    Current Outpatient Prescriptions  Medication Sig Dispense Refill  . sertraline (ZOLOFT) 50 MG tablet Take 1 tablet (50 mg total) by mouth daily. Take 2 and 1/2 tabs ( ) at bedtime. 225 tablet 3   No current facility-administered medications for this visit.     Family History  Problem Relation Age of Onset  . Thyroid disease Sister   . Hyperlipidemia Brother   . Cancer Father 63    Prostate CA    ROS:  Pertinent items are noted in HPI.  Otherwise, a comprehensive ROS was negative.  Exam:   BP 118/80 (BP  Location: Right Arm, Patient Position: Sitting, Cuff Size: Normal)   Pulse 72   Resp 16   Ht  (1.626 m)   Wt 198 lb 3.2 oz (89.9 kg)   LMP 12/15/2016   BMI 34.02 kg/m     General appearance: alert, cooperative and appears stated age Head: Normocephalic, without obvious abnormality, atraumatic Neck: no adenopathy, supple, symmetrical, trachea midline and thyroid normal to inspection and palpation Lungs: clear to auscultation bilaterally Breasts: normal appearance, no masses or tenderness, No nipple retraction or dimpling, No nipple discharge or bleeding, No axillary or supraclavicular adenopathy Heart: regular rate and rhythm Abdomen: soft, non-tender; no masses, no organomegaly Extremities: extremities normal, atraumatic, no cyanosis or edema Skin: Skin color, texture, turgor normal. No rashes or lesions Lymph nodes: Cervical, supraclavicular, and axillary nodes normal. No abnormal inguinal nodes palpated Neurologic: Grossly normal  Pelvic: External genitalia:  no lesions              Urethra:  normal appearing urethra with no masses, tenderness or lesions              Bartholins and Skenes: normal                 Vagina: normal appearing vagina with normal color and discharge, no lesions  Cervix: no lesions              Pap taken: Yes.   Bimanual Exam:  Uterus:  normal size, contour, position, consistency, mobility, non-tender              Adnexa: no mass, fullness, tenderness                Chaperone was present for exam.  Assessment:   Well woman visit with normal exam. Depression/anxiety/PMDD.  Plan: Mammogram screening discussed.  She will do this this fall.  Information on [facility] and [place]. Recommended self breast awareness. Pap and HR HPV as above. Guidelines for Calcium, Vitamin D, regular exercise program including cardiovascular and weight bearing exercise. Routine labs. Will increase Zoloft to 150 mg daily. Call if no improvement in PMDD  symptoms.  I encouraged her continued weight loss through diet and exercise. Follow up annually and prn.    After visit summary provided.

## 2017-01-02 ENCOUNTER — Encounter: Payer: Self-pay | Admitting: Obstetrics and Gynecology

## 2017-01-02 ENCOUNTER — Ambulatory Visit (INDEPENDENT_AMBULATORY_CARE_PROVIDER_SITE_OTHER): Payer: 59 | Admitting: Obstetrics and Gynecology

## 2017-01-02 VITALS — BP 118/80 | HR 72 | Resp 16 | Ht 64.0 in | Wt 198.2 lb

## 2017-01-02 DIAGNOSIS — Z01419 Encounter for gynecological examination (general) (routine) without abnormal findings: Secondary | ICD-10-CM

## 2017-01-02 LAB — CBC
HCT: 38.7 % (ref 35.0–45.0)
Hemoglobin: 12.8 g/dL (ref 11.7–15.5)
MCH: 30.3 pg (ref 27.0–33.0)
MCHC: 33.1 g/dL (ref 32.0–36.0)
MCV: 91.5 fL (ref 80.0–100.0)
MPV: 10.1 fL (ref 7.5–12.5)
PLATELETS: 305 10*3/uL (ref 140–400)
RBC: 4.23 MIL/uL (ref 3.80–5.10)
RDW: 14.1 % (ref 11.0–15.0)
WBC: 7.8 10*3/uL (ref 3.8–10.8)

## 2017-01-02 MED ORDER — SERTRALINE HCL 50 MG PO TABS: ORAL_TABLET | ORAL | 3 refills | Status: DC

## 2017-01-02 NOTE — Patient Instructions (Signed)

## 2017-01-03 LAB — VITAMIN D 25 HYDROXY (VIT D DEFICIENCY, FRACTURES): Vit D, 25-Hydroxy: 33 ng/mL (ref 30–100)

## 2017-01-03 LAB — LIPID PANEL
Cholesterol: 151 mg/dL (ref <200)
HDL: 38 mg/dL — ABNORMAL LOW (ref >50)
LDL CALC: 82 mg/dL (ref <100)
Total CHOL/HDL Ratio: 4 Ratio (ref <5.0)
Triglycerides: 154 mg/dL — ABNORMAL HIGH (ref <150)
VLDL: 31 mg/dL — AB (ref <30)

## 2017-01-03 LAB — TSH: TSH: 2.57 m[IU]/L

## 2017-01-03 LAB — COMPREHENSIVE METABOLIC PANEL
ALK PHOS: 47 U/L (ref 33–115)
ALT: 12 U/L (ref 6–29)
AST: 15 U/L (ref 10–30)
Albumin: 4.4 g/dL (ref 3.6–5.1)
BUN: 20 mg/dL (ref 7–25)
CO2: 24 mmol/L (ref 20–31)
Calcium: 9.2 mg/dL (ref 8.6–10.2)
Chloride: 105 mmol/L (ref 98–110)
Creat: 1.08 mg/dL (ref 0.50–1.10)
GLUCOSE: 84 mg/dL (ref 65–99)
Potassium: 4.4 mmol/L (ref 3.5–5.3)
Sodium: 137 mmol/L (ref 135–146)
Total Bilirubin: 0.4 mg/dL (ref 0.2–1.2)
Total Protein: 6.9 g/dL (ref 6.1–8.1)

## 2017-01-07 LAB — IPS PAP TEST WITH HPV

## 2017-07-14 ENCOUNTER — Telehealth: Payer: Self-pay | Admitting: Obstetrics and Gynecology

## 2017-07-14 NOTE — Telephone Encounter (Signed)
I would have her add the Wellbutrin XL 150 mg back to her regimen.  Ok to give refills until her annual exam is due.  She needs to try this for at least 6 - 8 weeks before we will know how her response is.  Please have her call in to report how she is doing.

## 2017-07-14 NOTE — Telephone Encounter (Signed)
Patient has a medication question. 

## 2017-07-14 NOTE — Telephone Encounter (Signed)
Spoke with patient. Patient states that she is willing to restart Wellbutrin XL if Dr.Silva feels this will be beneficial. States that when she was taking it previously with Zoloft she did not notice much of a difference, but is willing to try whatever is best.

## 2017-07-14 NOTE — Telephone Encounter (Signed)
Spoke with patient. Patient states that she is taking Zoloft 150 mg daily at bedtime for anxiety and depression. Patient reports waking up at 2 am and 4 am nightly due to anxiety. Feels she is grinding her teeth in her sleep due to having increased anxiety. Has seen a dentist and was given a mouth guard. Asking if there is something she can do to adjust her medication to reduce anxiety or if there is something else she can take. Advised will review with MD and return call.

## 2017-07-14 NOTE — Telephone Encounter (Signed)
Patient stopped her Wellbutrin LX 150 mg which she was taking along with the Zoloft.  Does she want to restart the Wellbutrin XL?

## 2017-07-15 MED ORDER — BUPROPION HCL ER (XL) 150 MG PO TB24: 150.0000 mg | ORAL_TABLET | Freq: Every day | ORAL | 5 refills | Status: DC

## 2017-07-15 NOTE — Telephone Encounter (Signed)
Spoke with patient. Advised of message as seen below from Dr.Silva. Patient verbalizes understanding. Rx for Wellbutrin XL 150 mg one tablet po daily #30 5RF sent to pharmacy on file. Patient will contact the office in 6-8 weeks to provide and update on how she is doing. Encounter closed.

## 2017-09-02 DIAGNOSIS — R7989 Other specified abnormal findings of blood chemistry: Secondary | ICD-10-CM

## 2017-09-02 HISTORY — DX: Other specified abnormal findings of blood chemistry: R79.89

## 2017-10-29 ENCOUNTER — Telehealth: Payer: Self-pay | Admitting: Obstetrics and Gynecology

## 2017-10-29 ENCOUNTER — Ambulatory Visit: Payer: 59 | Admitting: Obstetrics and Gynecology

## 2017-10-29 ENCOUNTER — Encounter: Payer: Self-pay | Admitting: Obstetrics and Gynecology

## 2017-10-29 ENCOUNTER — Other Ambulatory Visit: Payer: Self-pay

## 2017-10-29 VITALS — BP 116/68 | HR 68 | Resp 16 | Wt 211.0 lb

## 2017-10-29 DIAGNOSIS — R102 Pelvic and perineal pain: Secondary | ICD-10-CM | POA: Diagnosis not present

## 2017-10-29 DIAGNOSIS — N912 Amenorrhea, unspecified: Secondary | ICD-10-CM | POA: Diagnosis not present

## 2017-10-29 LAB — POCT URINE PREGNANCY: Preg Test, Ur: NEGATIVE

## 2017-10-29 MED ORDER — MEDROXYPROGESTERONE ACETATE 5 MG PO TABS: 5.0000 mg | ORAL_TABLET | Freq: Every day | ORAL | 0 refills | Status: DC

## 2017-10-29 MED ORDER — DROSPIRENONE-ETHINYL ESTRADIOL 3-0.02 MG PO TABS: 1.0000 | ORAL_TABLET | Freq: Every day | ORAL | 0 refills | Status: DC

## 2017-10-29 NOTE — Telephone Encounter (Signed)
Spoke with patient. Reports "PMS cramping" for the last 10 days, menses has not started. LMP "end of January". Describes as "uncomfortable, not pain, 7/10". Motrin PRN for pain.   Denies any other GYN symptoms, menses regular in the past.   Patient states she is leaving for vacation on Monday and is concerned her menses will start and be heavy, requesting medications/options to stop menses or reduce bleeding.   Recommended OV for further evaluation and discussion with Dr. Edward JollySilva. OV scheduled for today at 3pm with Dr. Edward JollySilva.   Routing to provider for final review. Patient is agreeable to disposition. Will close encounter.

## 2017-10-29 NOTE — Telephone Encounter (Signed)
Patient has a question for Dr.Silva about her cycle.

## 2017-10-29 NOTE — Progress Notes (Signed)
GYNECOLOGY  VISIT   HPI: 42 y.o.   Married  Caucasian  female   G2P0002 with Patient's last menstrual period was 10/01/2017.   here for  Severe menstrual cramps for 11 days with no bleeding. Per patient, due for period.  Menses are due.   Expecting her cycle and having significant cramping.  Feeling like she needs to start her cycle.  Trying Ibuprofen 400 mg at a time.  Trying heat.   Changing pad every 1.5 hours with her cycle. Last for 2 -3 days.   Last BM was this am.  This did not help her pain.   No dysuria.  No blood in the urine.   No partner change. No intercourse since the beginning of January.   Used Yasmin in the past.  Did well on it.   No hx elevated BP.  No hx of migraines with aura.  No liver disease.  No personal or family history of DVT/PE.   Going to [place] Monday.   UPT negative.    GYNECOLOGIC HISTORY: Patient's last menstrual period was 10/01/2017. Contraception:  Vasectomy Menopausal hormone therapy:  none Last mammogram:  none Last pap smear:   01/02/17 Pap and HR HPV negative        OB History    Gravida Para Term Preterm AB Living   2 2 0 0 0 2   SAB TAB Ectopic Multiple Live Births   0 0 0 0 2         There are no active problems to display for this patient.   Past Medical History:  Diagnosis Date  . Anxiety   . Depression     Past Surgical History:  Procedure Laterality Date  . CESAREAN SECTION  2007, 2010    Current Outpatient Medications  Medication Sig Dispense Refill  . sertraline (ZOLOFT) 50 MG tablet Take 3 tablets (150 mg) by mouth at bedtime. 270 tablet 3   No current facility-administered medications for this visit.      ALLERGIES: Patient has no known allergies.  Family History  Problem Relation Age of Onset  . Thyroid disease Sister   . Hyperlipidemia Brother   . Cancer Father 52       Prostate CA    Social History   Socioeconomic History  . Marital status: Married    Spouse name: Not on file   . Number of children: Not on file  . Years of education: Not on file  . Highest education level: Not on file  Social Needs  . Financial resource strain: Not on file  . Food insecurity - worry: Not on file  . Food insecurity - inability: Not on file  . Transportation needs - medical: Not on file  . Transportation needs - non-medical: Not on file  Occupational History  . Not on file  Tobacco Use  . Smoking status: Never Smoker  . Smokeless tobacco: Never Used  Substance and Sexual Activity  . Alcohol use: Yes    Alcohol/week: 1.2 oz    Types: 2 Standard drinks or equivalent per week  . Drug use: No  . Sexual activity: Yes    Partners: Male    Birth control/protection: Surgical    Comment: vasectomy  Other Topics Concern  . Not on file  Social History Narrative  . Not on file    ROS:  Pertinent items are noted in HPI.  PHYSICAL EXAMINATION:    BP 116/68 (BP Location: Right Arm, Patient Position: Sitting, Cuff Size:  Normal)   Pulse 68   Resp 16   Wt 211 lb (95.7 kg)   LMP 10/01/2017   BMI 36.22 kg/m     General appearance: alert, cooperative and appears stated age  Pelvic: External genitalia:  no lesions              Urethra:  normal appearing urethra with no masses, tenderness or lesions              Bartholins and Skenes: normal                 Vagina: normal appearing vagina with normal color and discharge, no lesions              Cervix: no lesions                Bimanual Exam:  Uterus:  normal size, contour, position, consistency, mobility, non-tender              Adnexa: no mass, fullness, tenderness          Chaperone was present for exam.  ASSESSMENT  PMS symptoms.  Pelvic cramping. Vasectomy for contraception.  UPT negative.  Not SA for 6 weeks.  PLAN  Provera 50 mg x 5 days.  Yaz.  3 packs, no refills.  Discussed risks of DVT, PE, MI, and stroke. Follow up for annual exam in May 2019.    An After Visit Summary was printed and given to the  patient.  __15____ minutes face to face time of which over 50% was spent in counseling.

## 2018-01-08 ENCOUNTER — Encounter: Payer: Self-pay | Admitting: Obstetrics and Gynecology

## 2018-01-08 ENCOUNTER — Other Ambulatory Visit: Payer: Self-pay

## 2018-01-08 ENCOUNTER — Ambulatory Visit (INDEPENDENT_AMBULATORY_CARE_PROVIDER_SITE_OTHER): Payer: 59 | Admitting: Obstetrics and Gynecology

## 2018-01-08 VITALS — BP 110/70 | HR 72 | Resp 16 | Ht 64.25 in | Wt 204.0 lb

## 2018-01-08 DIAGNOSIS — Z01419 Encounter for gynecological examination (general) (routine) without abnormal findings: Secondary | ICD-10-CM

## 2018-01-08 DIAGNOSIS — R7989 Other specified abnormal findings of blood chemistry: Secondary | ICD-10-CM

## 2018-01-08 MED ORDER — DROSPIRENONE-ETHINYL ESTRADIOL 3-0.02 MG PO TABS: 1.0000 | ORAL_TABLET | Freq: Every day | ORAL | 3 refills | Status: DC

## 2018-01-08 MED ORDER — SERTRALINE HCL 50 MG PO TABS: ORAL_TABLET | ORAL | 3 refills | Status: DC

## 2018-01-08 NOTE — Patient Instructions (Signed)

## 2018-01-08 NOTE — Progress Notes (Signed)
42 y.o. G50P0002 Married Caucasian female here for annual exam.    Took Provera and this brought on a menses.  Then started Yaz.  Has been on this for 2 months now.  Light cycles and not much cramping.  No problems with pills.   Lost 7 pounds.   Wants refill for Zoloft which is working well.   Not sleeping well.  Does need to get up at 2:00 to care for her child which is type 1 diabetic.   PCP: No PCP   Patient's last menstrual period was 12/25/2017.           Sexually active: Yes.    The current method of family planning is vasectomy.    Exercising: Yes.    strength and cardio 3x a week Smoker:  no  Health Maintenance: Pap:  01/02/17 Pap and HR HPV negative History of abnormal Pap:  Yes, 2007 abnormal pap, no colposcopy, no treatment--repeat pap normal. MMG:  never Colonoscopy:  n/a BMD:   n/a  Result  n/a TDaP:  2014 Gardasil:   no HIV: negative in the past Hep C: never Screening Labs: discuss today   reports that she has never smoked. She has never used smokeless tobacco. She reports that she drinks about 1.2 oz of alcohol per week. She reports that she does not use drugs.  Past Medical History:  Diagnosis Date  . Anxiety   . Depression     Past Surgical History:  Procedure Laterality Date  . CESAREAN SECTION  2007, 2010    Current Outpatient Medications  Medication Sig Dispense Refill  . drospirenone-ethinyl estradiol (YAZ,GIANVI,LORYNA) 3-0.02 MG tablet Take 1 tablet by mouth daily. 3 Package 0  . sertraline (ZOLOFT) 50 MG tablet Take 3 tablets (150 mg) by mouth at bedtime. 270 tablet 3   No current facility-administered medications for this visit.     Family History  Problem Relation Age of Onset  . Thyroid disease Sister   . Hyperlipidemia Brother   . Cancer Father 32       Prostate CA    Review of Systems  Constitutional: Negative.   HENT: Negative.   Eyes: Negative.   Respiratory: Negative.   Cardiovascular: Negative.   Gastrointestinal:  Negative.   Endocrine: Negative.   Genitourinary: Negative.   Musculoskeletal: Negative.   Skin: Negative.   Allergic/Immunologic: Negative.   Neurological: Negative.   Hematological: Negative.   Psychiatric/Behavioral: Negative.     Exam:   BP 110/70 (BP Location: Right Arm, Patient Position: Sitting, Cuff Size: Large)   Pulse 72   Resp 16   Ht 5' 4.25" (1.632 m)   Wt 204 lb (92.5 kg)   LMP 12/25/2017   BMI 34.74 kg/m     General appearance: alert, cooperative and appears stated age Head: Normocephalic, without obvious abnormality, atraumatic Neck: no adenopathy, supple, symmetrical, trachea midline and thyroid normal to inspection and palpation Lungs: clear to auscultation bilaterally Breasts: normal appearance, no masses or tenderness, No nipple retraction or dimpling, No nipple discharge or bleeding, No axillary or supraclavicular adenopathy Heart: regular rate and rhythm Abdomen: soft, non-tender; no masses, no organomegaly Extremities: extremities normal, atraumatic, no cyanosis or edema Skin: Skin color, texture, turgor normal. No rashes or lesions Lymph nodes: Cervical, supraclavicular, and axillary nodes normal. No abnormal inguinal nodes palpated Neurologic: Grossly normal  Pelvic: External genitalia:  no lesions              Urethra:  normal appearing urethra with no masses,  tenderness or lesions              Bartholins and Skenes: normal                 Vagina: normal appearing vagina with normal color and discharge, no lesions              Cervix: no lesions              Pap taken: No. Bimanual Exam:  Uterus:  normal size, contour, position, consistency, mobility, non-tender              Adnexa: no mass, fullness, tenderness              Rectal exam: Yes.  .  Confirms.              Anus:  normal sphincter tone, no lesions  Chaperone was present for exam.  Assessment:   Well woman visit with normal exam. Doing well on combined OCPs. Depression and anxiety.   Sleep disturbance.   Plan: Mammogram screening. Recommended self breast awareness. Pap and HR HPV as above. Guidelines for Calcium, Vitamin D, regular exercise program including cardiovascular and weight bearing exercise. Routine labs. Refill of Zoloft and Yaz for one year.  Try melatonin.  Avoid caffeine in evening.  Follow up annually and prn.   After visit summary provided.

## 2018-01-09 LAB — CBC
HEMATOCRIT: 40.9 % (ref 34.0–46.6)
Hemoglobin: 13.3 g/dL (ref 11.1–15.9)
MCH: 30.4 pg (ref 26.6–33.0)
MCHC: 32.5 g/dL (ref 31.5–35.7)
MCV: 94 fL (ref 79–97)
PLATELETS: 324 10*3/uL (ref 150–379)
RBC: 4.37 x10E6/uL (ref 3.77–5.28)
RDW: 13.8 % (ref 12.3–15.4)
WBC: 6.2 10*3/uL (ref 3.4–10.8)

## 2018-01-09 LAB — COMPREHENSIVE METABOLIC PANEL
A/G RATIO: 1.9 (ref 1.2–2.2)
ALT: 12 IU/L (ref 0–32)
AST: 13 IU/L (ref 0–40)
Albumin: 4.8 g/dL (ref 3.5–5.5)
Alkaline Phosphatase: 45 IU/L (ref 39–117)
BUN / CREAT RATIO: 16 (ref 9–23)
BUN: 17 mg/dL (ref 6–24)
Bilirubin Total: 0.3 mg/dL (ref 0.0–1.2)
CALCIUM: 9.5 mg/dL (ref 8.7–10.2)
CO2: 21 mmol/L (ref 20–29)
Chloride: 101 mmol/L (ref 96–106)
Creatinine, Ser: 1.07 mg/dL — ABNORMAL HIGH (ref 0.57–1.00)
GFR, EST AFRICAN AMERICAN: 75 mL/min/{1.73_m2} (ref >59)
GFR, EST NON AFRICAN AMERICAN: 65 mL/min/{1.73_m2} (ref >59)
GLOBULIN, TOTAL: 2.5 g/dL (ref 1.5–4.5)
Glucose: 94 mg/dL (ref 65–99)
POTASSIUM: 4.8 mmol/L (ref 3.5–5.2)
SODIUM: 136 mmol/L (ref 134–144)
Total Protein: 7.3 g/dL (ref 6.0–8.5)

## 2018-01-09 LAB — LIPID PANEL
CHOLESTEROL TOTAL: 203 mg/dL — AB (ref 100–199)
Chol/HDL Ratio: 3.7 ratio (ref 0.0–4.4)
HDL: 55 mg/dL (ref >39)
LDL Calculated: 111 mg/dL — ABNORMAL HIGH (ref 0–99)
Triglycerides: 185 mg/dL — ABNORMAL HIGH (ref 0–149)
VLDL Cholesterol Cal: 37 mg/dL (ref 5–40)

## 2018-01-09 LAB — VITAMIN D 25 HYDROXY (VIT D DEFICIENCY, FRACTURES): Vit D, 25-Hydroxy: 28.7 ng/mL — ABNORMAL LOW (ref 30.0–100.0)

## 2018-01-09 LAB — TSH: TSH: 3.92 u[IU]/mL (ref 0.450–4.500)

## 2018-01-12 ENCOUNTER — Encounter: Payer: Self-pay | Admitting: Obstetrics and Gynecology

## 2018-01-12 NOTE — Addendum Note (Signed)
Addended by: Ardell Isaacs, Debbe Bales E on: 01/12/2018 05:52 AM   Modules accepted: Orders

## 2018-02-09 ENCOUNTER — Other Ambulatory Visit (INDEPENDENT_AMBULATORY_CARE_PROVIDER_SITE_OTHER): Payer: 59

## 2018-02-09 DIAGNOSIS — R7989 Other specified abnormal findings of blood chemistry: Secondary | ICD-10-CM

## 2018-02-10 LAB — BASIC METABOLIC PANEL
BUN / CREAT RATIO: 18 (ref 9–23)
BUN: 17 mg/dL (ref 6–24)
CHLORIDE: 104 mmol/L (ref 96–106)
CO2: 21 mmol/L (ref 20–29)
CREATININE: 0.96 mg/dL (ref 0.57–1.00)
Calcium: 9.3 mg/dL (ref 8.7–10.2)
GFR calc non Af Amer: 74 mL/min/{1.73_m2} (ref >59)
GFR, EST AFRICAN AMERICAN: 86 mL/min/{1.73_m2} (ref >59)
GLUCOSE: 95 mg/dL (ref 65–99)
Potassium: 5 mmol/L (ref 3.5–5.2)
SODIUM: 141 mmol/L (ref 134–144)

## 2018-02-18 ENCOUNTER — Other Ambulatory Visit: Payer: Self-pay | Admitting: Obstetrics and Gynecology

## 2018-02-18 DIAGNOSIS — Z1231 Encounter for screening mammogram for malignant neoplasm of breast: Secondary | ICD-10-CM

## 2018-03-10 ENCOUNTER — Ambulatory Visit
Admission: RE | Admit: 2018-03-10 | Discharge: 2018-03-10 | Disposition: A | Discharge status: Home / Self Care | Payer: Self-pay | Source: Ambulatory Visit | Attending: Obstetrics and Gynecology | Admitting: Obstetrics and Gynecology

## 2018-03-10 DIAGNOSIS — Z1231 Encounter for screening mammogram for malignant neoplasm of breast: Secondary | ICD-10-CM

## 2018-08-05 ENCOUNTER — Other Ambulatory Visit: Payer: Self-pay | Admitting: Obstetrics and Gynecology

## 2018-08-10 ENCOUNTER — Other Ambulatory Visit: Payer: Self-pay | Admitting: Obstetrics and Gynecology

## 2018-08-10 NOTE — Telephone Encounter (Signed)
Medication refill request: Wellbutrin  Last AEX:  01-08-18 BS  Next AEX: 01-27-19  Last MMG (if hormonal medication request): 03-10-18 density A/BIRADS 1 negative  Refill authorized: Please advise.   Call to patient. Patient states that when Dr. Edward JollySilva originally prescribed wellbutrin, she did not take it. Patient states her son started middle school and she has since started taking wellbutrin. Requests a refill. [facility].

## 2018-08-11 NOTE — Telephone Encounter (Signed)
Please reach out to patient for clarification of what she is taking at this point for depression and anxiety.   What has prompted the change and who has prescribed the Wellbutrin?

## 2018-08-12 NOTE — Telephone Encounter (Signed)
Left message to call Dayvion Sans, RN at [facility] [phone].   

## 2018-08-14 NOTE — Telephone Encounter (Signed)
Left message to call Noreene LarssonJill, RN at [facility] in regard to prescription request, [phone].

## 2018-08-14 NOTE — Telephone Encounter (Signed)
Spoke with patient.  She is taking Zoloft and Wellbutrin.  Started the Wellbutrin in the Summer, increased stress with family.  . Had been previously prescribed by Dr. Edward JollySilva, just did not start it.  She feels stable on the Zoloft 150 mg po daily and Wellbutrin 150 XL. Would like to continue on both if okay with Dr. Edward JollySilva.

## 2018-10-22 ENCOUNTER — Other Ambulatory Visit: Payer: Self-pay | Admitting: Obstetrics and Gynecology

## 2018-11-23 ENCOUNTER — Other Ambulatory Visit: Payer: Self-pay | Admitting: Obstetrics and Gynecology

## 2018-11-23 NOTE — Telephone Encounter (Signed)
Medication refill request: Devonne Doughty Last AEX:  01-08-18 Next AEX: 01-27-2019 Last MMG (if hormonal medication request): 03/10/18 category a density birads 1:neg Refill authorized: patient needs 90 day supply to get to [facility]. Please approve if appropriate

## 2018-12-07 IMAGING — MG DIGITAL SCREENING BILATERAL MAMMOGRAM WITH TOMO AND CAD
8 series · 9 of 24 positions shown · non-contrast
Comparison: None.

ACR Breast Density Category a: The breast tissue is almost entirely
fatty.

CLINICAL DATA: Screening. Baseline.

EXAM:
DIGITAL SCREENING BILATERAL MAMMOGRAM WITH TOMO AND CAD

[L CC synth-2D]
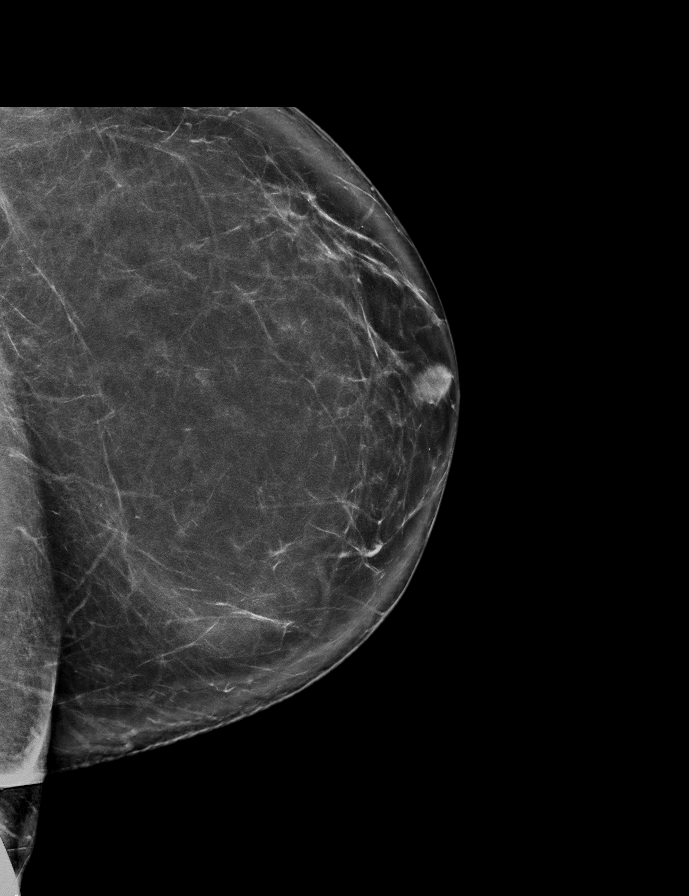

[R CC synth-2D]
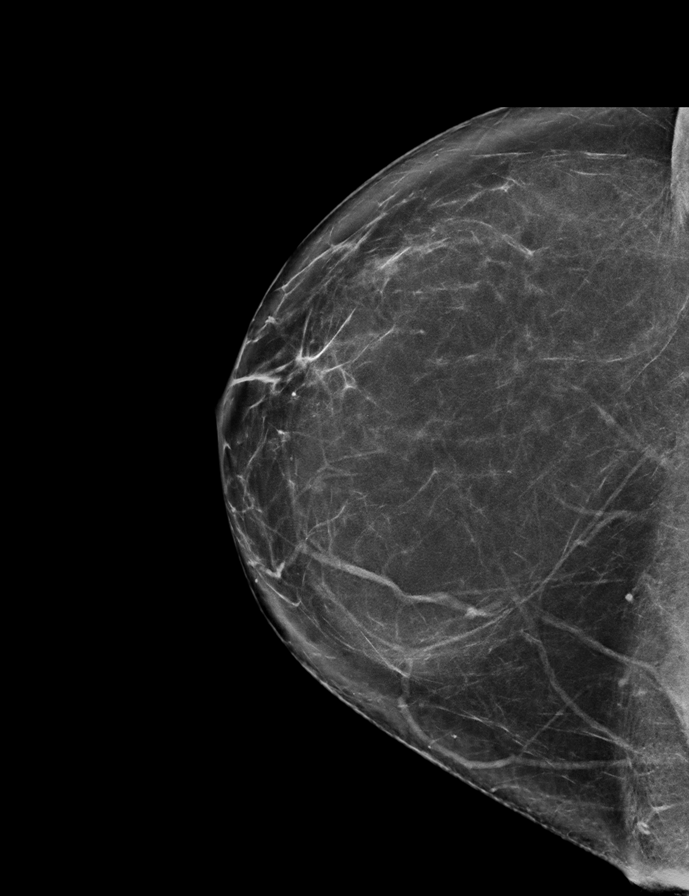

[L MLO synth-2D]
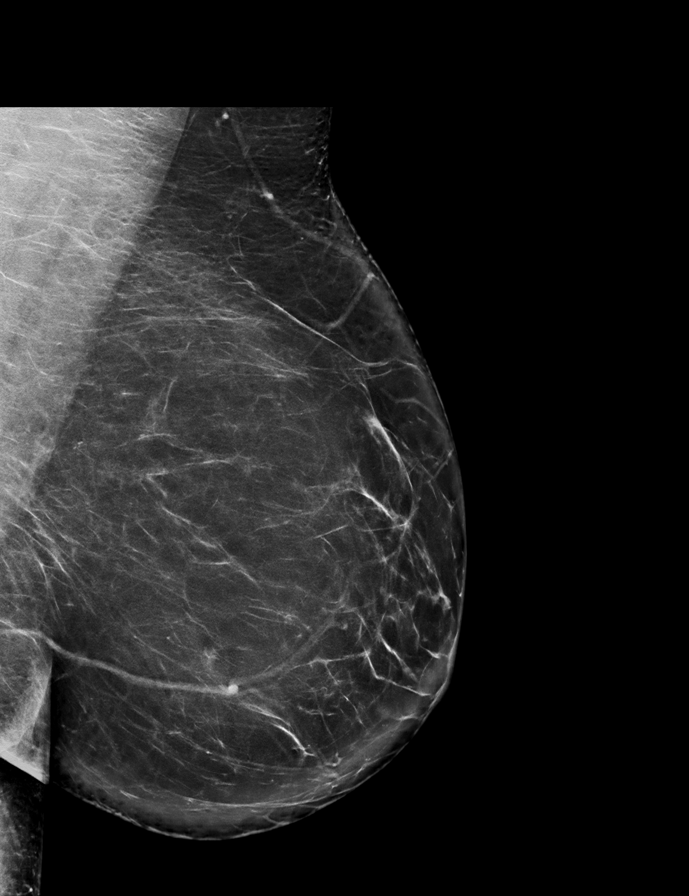

[R MLO synth-2D]
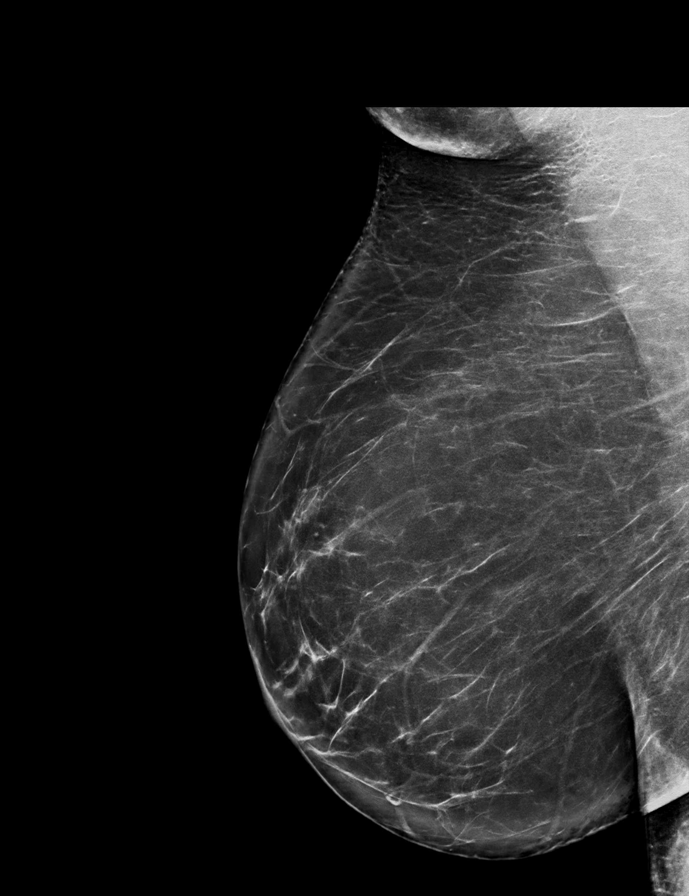

[L MLO tomo · 2 of 89 frames shown]
[frame 29/89]
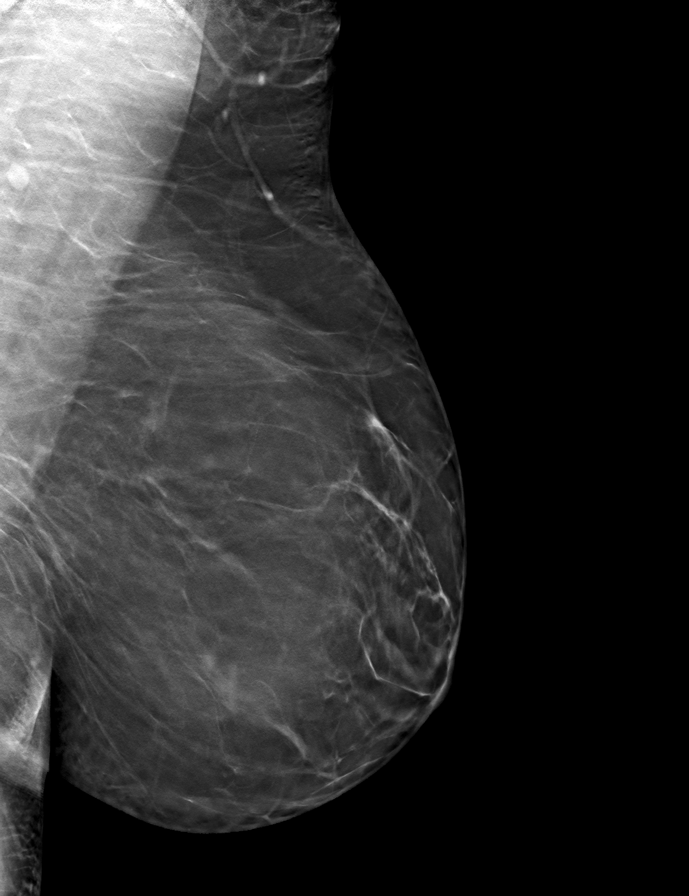
[frame 45/89]
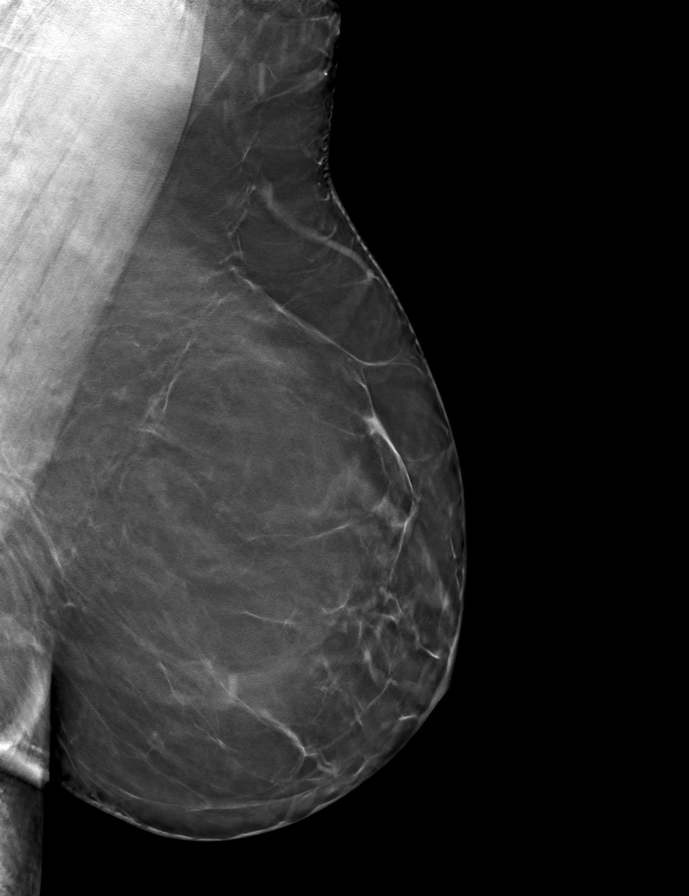

[L CC tomo · tomo slice 41/80.0]
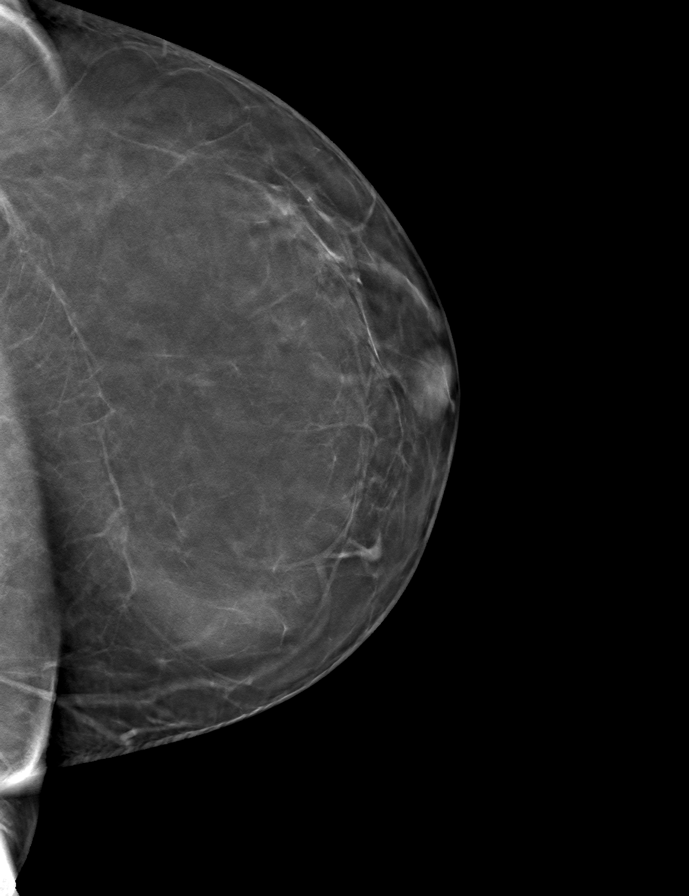

[R MLO tomo · tomo slice 45/89.0]
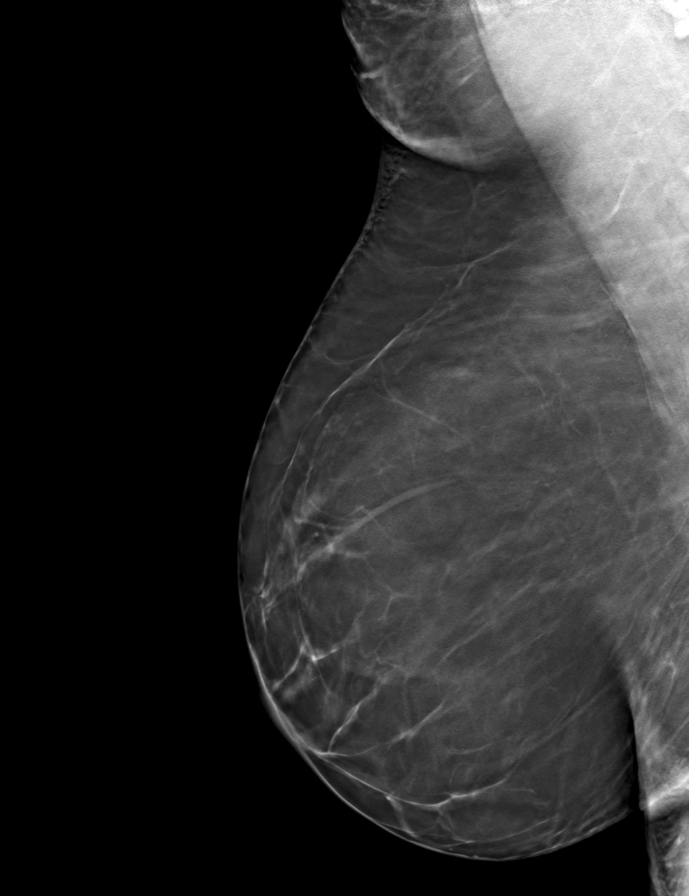

[R CC tomo · tomo slice 39/76.0]
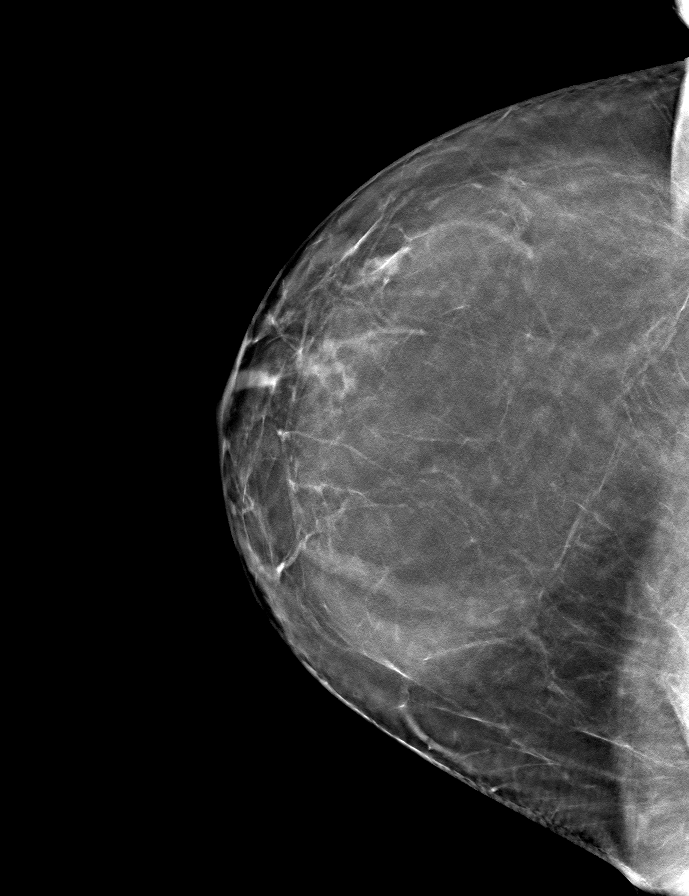

[9 of 24 positions shown; findings below may reference images not displayed]

FINDINGS: There are no findings suspicious for malignancy. Images were
processed with CAD.
IMPRESSION: No mammographic evidence of malignancy. A result letter of this
screening mammogram will be mailed directly to the patient.

RECOMMENDATION:
Screening mammogram in one year. (Code:9U-P-1N6)

BI-RADS CATEGORY  1: Negative.

## 2019-01-20 ENCOUNTER — Other Ambulatory Visit: Payer: Self-pay | Admitting: Obstetrics and Gynecology

## 2019-01-20 NOTE — Telephone Encounter (Signed)
[facility] called stating to disregard the note with the request.

## 2019-01-20 NOTE — Telephone Encounter (Signed)
Patient cancelled annual exam and will call back to reschedule. States she will need refill on Zoloft. States she only needs a month or so to get her through until she can reschedule her appointment.

## 2019-01-20 NOTE — Telephone Encounter (Signed)
Medication refill request: Zoloft Last AEX:  01/08/18 BS Next AEX: 01/27/19 Last MMG (if hormonal medication request): 03/10/18 BIRADS 1 negative/density a Refill authorized: Order pended #270 w/3 refills if authorized

## 2019-01-27 ENCOUNTER — Ambulatory Visit: Payer: 59 | Admitting: Obstetrics and Gynecology

## 2019-02-17 ENCOUNTER — Other Ambulatory Visit: Payer: Self-pay | Admitting: Obstetrics and Gynecology

## 2019-03-17 ENCOUNTER — Other Ambulatory Visit: Payer: Self-pay | Admitting: Obstetrics and Gynecology

## 2019-03-17 ENCOUNTER — Other Ambulatory Visit: Payer: Self-pay | Admitting: Physician Assistant

## 2019-03-17 DIAGNOSIS — Z1231 Encounter for screening mammogram for malignant neoplasm of breast: Secondary | ICD-10-CM

## 2019-05-03 ENCOUNTER — Other Ambulatory Visit: Payer: Self-pay

## 2019-05-03 ENCOUNTER — Ambulatory Visit
Admission: RE | Admit: 2019-05-03 | Discharge: 2019-05-03 | Disposition: A | Discharge status: Home / Self Care | Payer: 59 | Source: Ambulatory Visit | Attending: Physician Assistant | Admitting: Physician Assistant

## 2019-05-03 DIAGNOSIS — Z1231 Encounter for screening mammogram for malignant neoplasm of breast: Secondary | ICD-10-CM

## 2020-09-06 ENCOUNTER — Other Ambulatory Visit: Payer: Self-pay | Admitting: Obstetrics and Gynecology

## 2020-09-06 DIAGNOSIS — R928 Other abnormal and inconclusive findings on diagnostic imaging of breast: Secondary | ICD-10-CM

## 2020-09-07 ENCOUNTER — Other Ambulatory Visit: Payer: 59

## 2020-09-07 ENCOUNTER — Ambulatory Visit
Admission: RE | Admit: 2020-09-07 | Discharge: 2020-09-07 | Disposition: A | Discharge status: Home / Self Care | Payer: Self-pay | Source: Ambulatory Visit | Attending: Obstetrics and Gynecology | Admitting: Obstetrics and Gynecology

## 2020-09-07 ENCOUNTER — Ambulatory Visit
Admission: RE | Admit: 2020-09-07 | Discharge: 2020-09-07 | Disposition: A | Discharge status: Home / Self Care | Payer: 59 | Source: Ambulatory Visit | Attending: Obstetrics and Gynecology | Admitting: Obstetrics and Gynecology

## 2020-09-07 ENCOUNTER — Other Ambulatory Visit: Payer: Self-pay

## 2020-09-07 DIAGNOSIS — R928 Other abnormal and inconclusive findings on diagnostic imaging of breast: Secondary | ICD-10-CM

## 2020-09-20 ENCOUNTER — Other Ambulatory Visit: Payer: 59

## 2021-08-29 ENCOUNTER — Other Ambulatory Visit: Payer: Self-pay | Admitting: Obstetrics and Gynecology

## 2021-08-29 DIAGNOSIS — Z1231 Encounter for screening mammogram for malignant neoplasm of breast: Secondary | ICD-10-CM

## 2021-09-24 ENCOUNTER — Ambulatory Visit
Admission: RE | Admit: 2021-09-24 | Discharge: 2021-09-24 | Disposition: A | Discharge status: Home / Self Care | Payer: No Typology Code available for payment source | Source: Ambulatory Visit | Attending: Obstetrics and Gynecology | Admitting: Obstetrics and Gynecology

## 2021-09-24 DIAGNOSIS — Z1231 Encounter for screening mammogram for malignant neoplasm of breast: Secondary | ICD-10-CM

## 2022-04-17 ENCOUNTER — Other Ambulatory Visit: Payer: Self-pay | Admitting: Obstetrics and Gynecology

## 2022-04-17 DIAGNOSIS — Z1231 Encounter for screening mammogram for malignant neoplasm of breast: Secondary | ICD-10-CM

## 2022-06-23 IMAGING — MG MM DIGITAL SCREENING BILAT W/ TOMO AND CAD
8 series · 8 of 24 positions shown · non-contrast
Comparison: Previous exam(s).

CLINICAL DATA: Screening.

EXAM:
DIGITAL SCREENING BILATERAL MAMMOGRAM WITH TOMOSYNTHESIS AND CAD
TECHNIQUE: Bilateral screening digital craniocaudal and mediolateral oblique
mammograms were obtained. Bilateral screening digital breast
tomosynthesis was performed. The images were evaluated with
computer-aided detection.

[L MLO synth-2D]
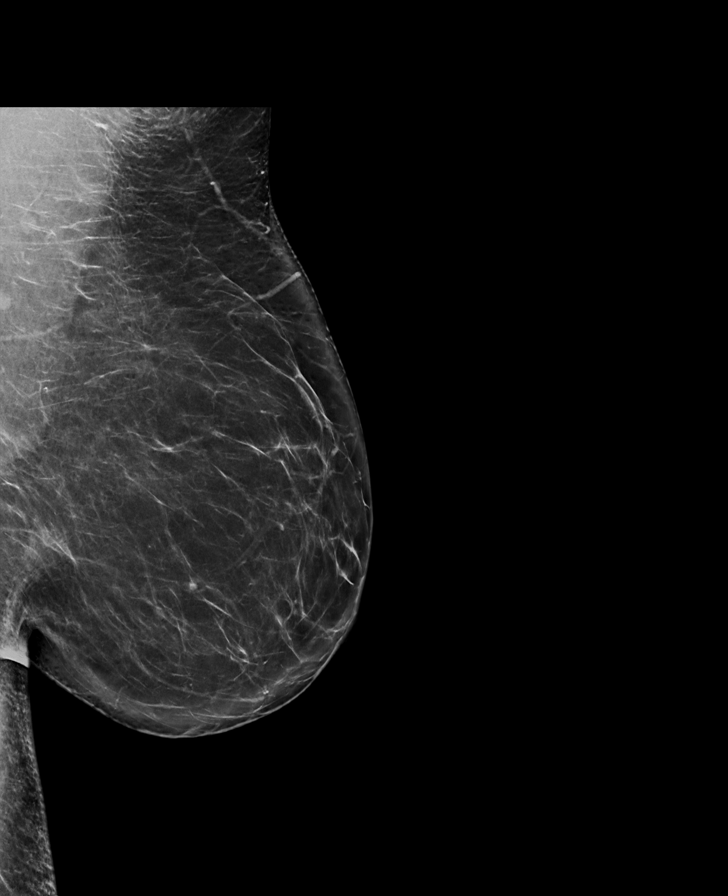

[R MLO synth-2D]
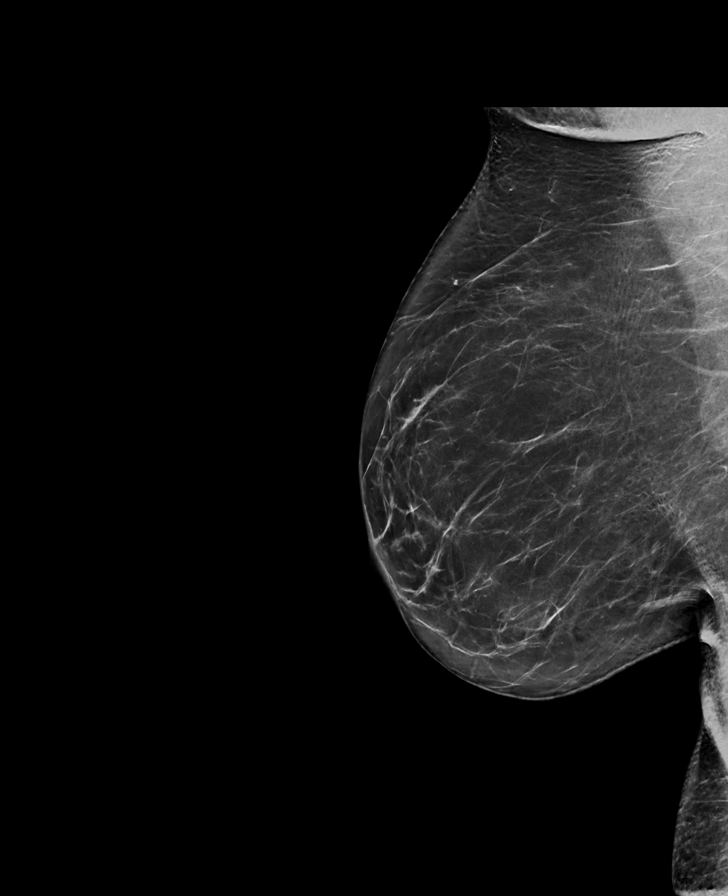

[R CC synth-2D]
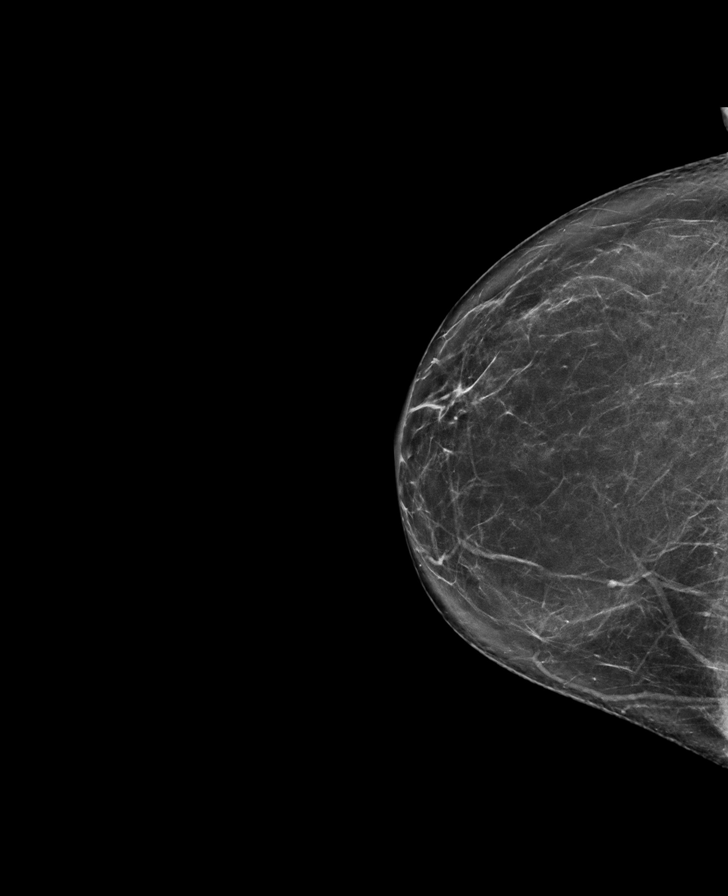

[L CC synth-2D]
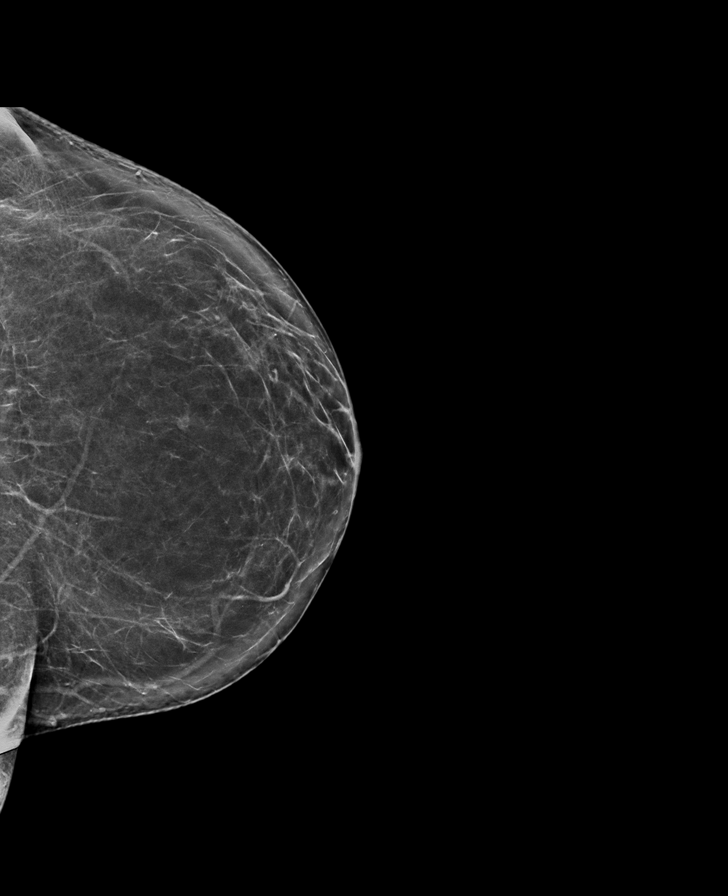

[R MLO tomo · tomo slice 44/87.0]
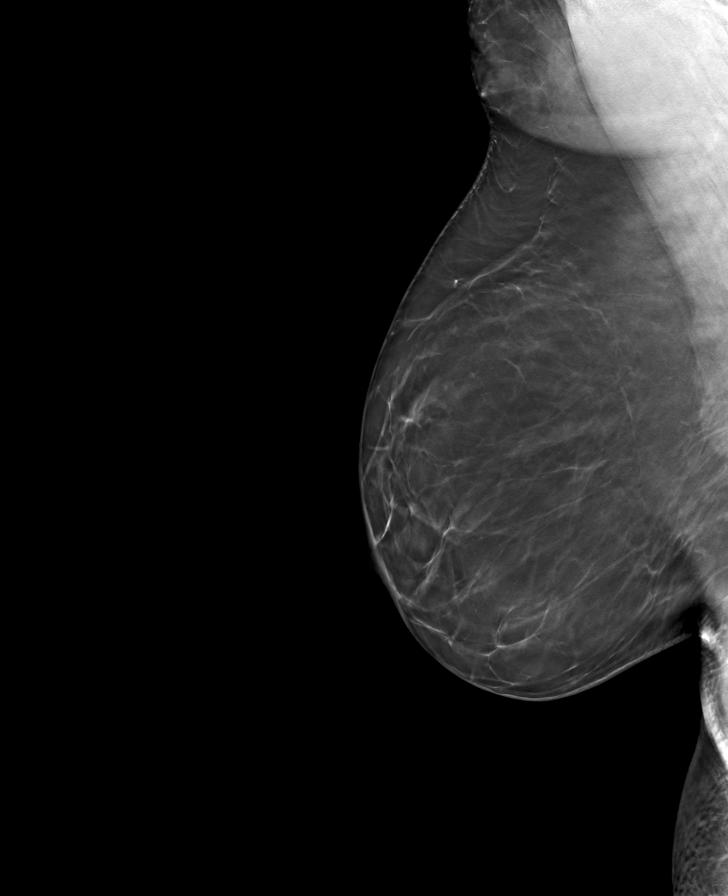

[R CC tomo · tomo slice 38/75.0]
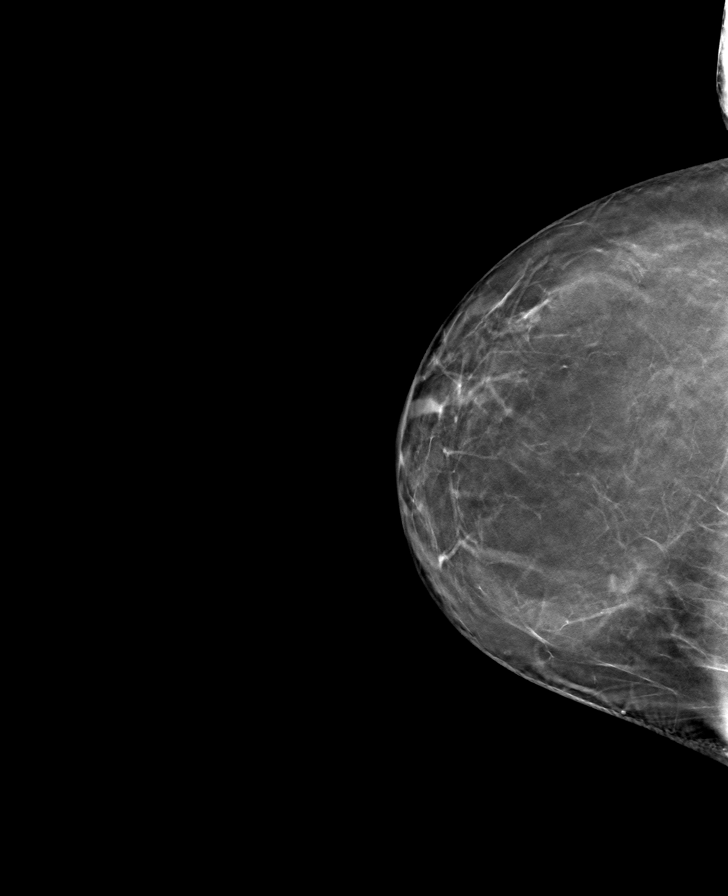

[L MLO tomo · tomo slice 43/84.0]
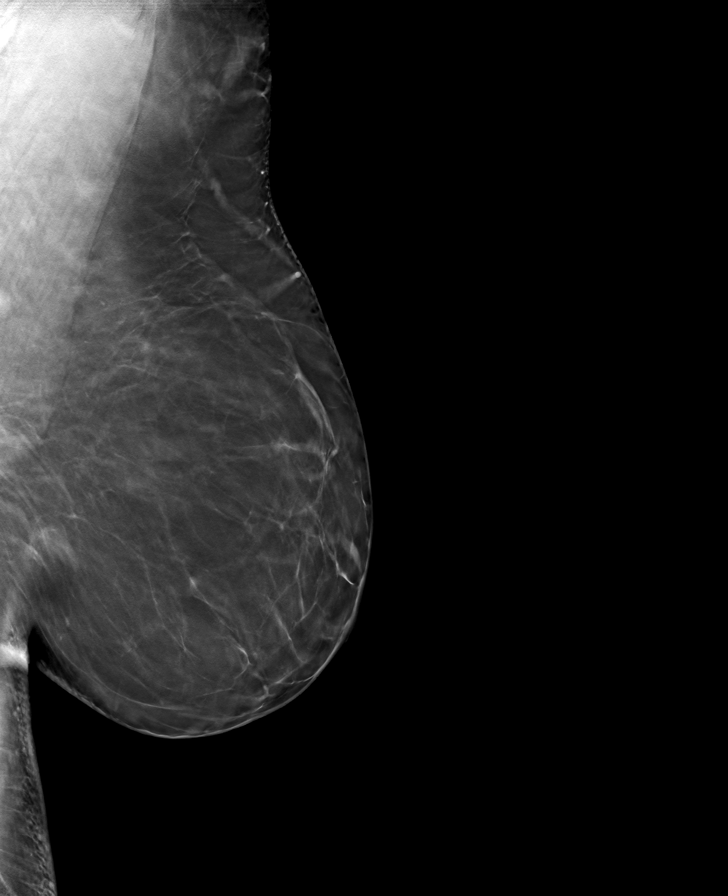

[L CC tomo · tomo slice 37/74.0]
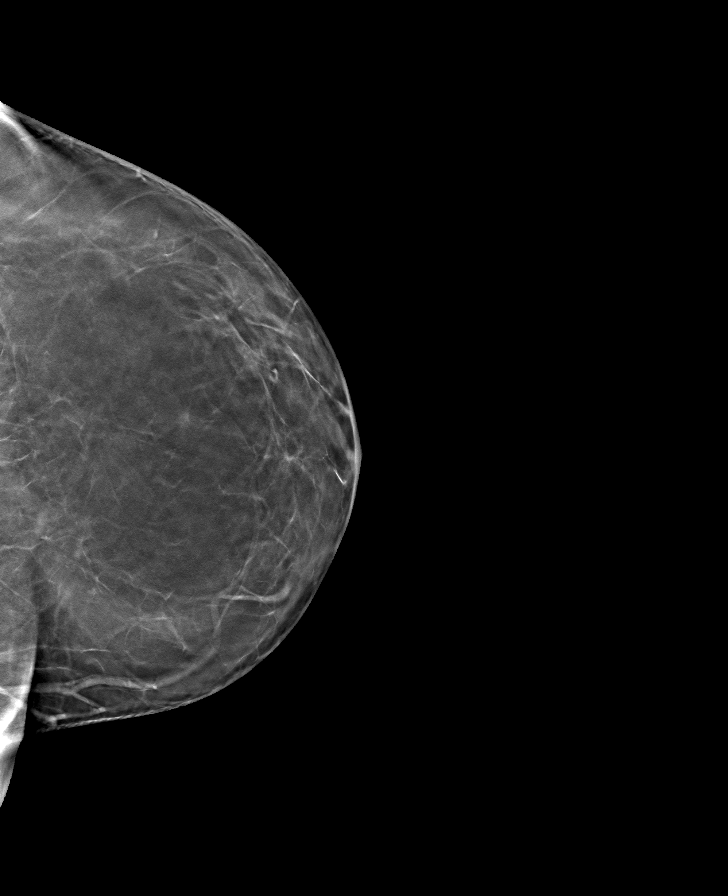

[8 of 24 positions shown; findings below may reference images not displayed]

ACR Breast Density Category b: There are scattered areas of
fibroglandular density.
FINDINGS: There are no findings suspicious for malignancy.
IMPRESSION: No mammographic evidence of malignancy. A result letter of this
screening mammogram will be mailed directly to the patient.

RECOMMENDATION:
Screening mammogram in one year. (Code:51-O-LD2)

BI-RADS CATEGORY  1: Negative.

## 2022-09-25 ENCOUNTER — Ambulatory Visit
Admission: RE | Admit: 2022-09-25 | Discharge: 2022-09-25 | Disposition: A | Discharge status: Home / Self Care | Payer: No Typology Code available for payment source | Source: Ambulatory Visit | Attending: Obstetrics and Gynecology | Admitting: Obstetrics and Gynecology

## 2022-09-25 DIAGNOSIS — Z1231 Encounter for screening mammogram for malignant neoplasm of breast: Secondary | ICD-10-CM

## 2023-03-31 ENCOUNTER — Encounter (HOSPITAL_COMMUNITY): Payer: Self-pay

## 2023-03-31 ENCOUNTER — Emergency Department (HOSPITAL_COMMUNITY): Payer: No Typology Code available for payment source

## 2023-03-31 ENCOUNTER — Emergency Department (HOSPITAL_COMMUNITY)
Admission: EM | Admit: 2023-03-31 | Discharge: 2023-03-31 | Disposition: A | Discharge status: Home / Self Care | Payer: No Typology Code available for payment source | Attending: Emergency Medicine | Admitting: Emergency Medicine

## 2023-03-31 DIAGNOSIS — R0781 Pleurodynia: Secondary | ICD-10-CM

## 2023-03-31 DIAGNOSIS — S30811A Abrasion of abdominal wall, initial encounter: Secondary | ICD-10-CM | POA: Diagnosis not present

## 2023-03-31 DIAGNOSIS — S20212A Contusion of left front wall of thorax, initial encounter: Secondary | ICD-10-CM | POA: Insufficient documentation

## 2023-03-31 DIAGNOSIS — R0789 Other chest pain: Secondary | ICD-10-CM | POA: Diagnosis present

## 2023-03-31 DIAGNOSIS — W19XXXA Unspecified fall, initial encounter: Secondary | ICD-10-CM

## 2023-03-31 DIAGNOSIS — W01190A Fall on same level from slipping, tripping and stumbling with subsequent striking against furniture, initial encounter: Secondary | ICD-10-CM | POA: Insufficient documentation

## 2023-03-31 LAB — URINALYSIS, ROUTINE W REFLEX MICROSCOPIC
Bilirubin Urine: NEGATIVE
Glucose, UA: NEGATIVE mg/dL
Hgb urine dipstick: NEGATIVE
Ketones, ur: NEGATIVE mg/dL
Leukocytes,Ua: NEGATIVE
Nitrite: NEGATIVE
Protein, ur: NEGATIVE mg/dL
Specific Gravity, Urine: 1.028 (ref 1.005–1.030)
pH: 6 (ref 5.0–8.0)

## 2023-03-31 MED ORDER — OXYCODONE-ACETAMINOPHEN 5-325 MG PO TABS: 1.0000 | ORAL_TABLET | Freq: Three times a day (TID) | ORAL | 0 refills | Status: AC | PRN

## 2023-03-31 MED ORDER — IBUPROFEN 800 MG PO TABS
800.0000 mg | ORAL_TABLET | Freq: Once | ORAL | Status: AC
  Administered 2023-03-31: 800 mg via ORAL
  Filled 2023-03-31: qty 1

## 2023-03-31 MED ORDER — OXYCODONE-ACETAMINOPHEN 5-325 MG PO TABS
1.0000 | ORAL_TABLET | Freq: Once | ORAL | Status: AC
  Administered 2023-03-31: 1 via ORAL
  Filled 2023-03-31: qty 1

## 2023-03-31 MED ORDER — IBUPROFEN 800 MG PO TABS: 800.0000 mg | ORAL_TABLET | Freq: Three times a day (TID) | ORAL | 0 refills | Status: AC | PRN

## 2023-03-31 NOTE — Discharge Instructions (Addendum)
For the next 10 days I recommend to use incentive spirometer every 1-2 hours while you are awake, taking 10 slow breaths and at a time.  This is designed as an exercise to keep expanding your lungs and prevent you from developing pneumonia or infection.  If you have symptoms of worsening pain, specifically in your abdomen, or new symptoms of nausea, vomiting, bloody stools, dark or bloody urine, lightheadedness, or elevated heart rate (above 100 beats per minute at rest), or feeling like passing out, please return to the ER.  These may be signs of more serious developing injury, including internal injury, that needs medical attention.

## 2023-03-31 NOTE — ED Provider Notes (Signed)
[place] EMERGENCY DEPARTMENT AT [facility] Provider Note   CSN: [id number] Arrival date & time: 03/31/23  1247     History  Chief Complaint  Patient presents with   Fall   Hip Pain    Suzanne Rios is a 47 y.o. female presented to ED with complaint of fall and left-sided hip and flank pain.  Patient reports that she tripped going downstairs earlier, approximately 2 steps, and struck her left side against a cabinet.  She is reporting sharp, pleuritic pain on the lower left lateral side of her chest and her left flank.  The pain feels worse with sitting and better with ambulation.  Denies striking her head.  She is not on anticoagulation.  She says she urinated normally earlier today  HPI     Home Medications Prior to Admission medications   Medication Sig Start Date End Date Taking? Authorizing Provider  ibuprofen (ADVIL) 800 MG tablet Take 1 tablet (800 mg total) by mouth every 8 (eight) hours as needed for up to 30 doses. 03/31/23  Yes Kyl Givler, Kermit Balo, MD  oxyCODONE-acetaminophen (PERCOCET/ROXICET) 5-325 MG tablet Take 1 tablet by mouth every 8 (eight) hours as needed for up to 15 doses for severe pain. 03/31/23  Yes Terald Sleeper, MD  buPROPion (WELLBUTRIN XL) 150 MG 24 hr tablet TAKE 1 TABLET BY MOUTH DAILY 08/14/18   Patton Salles, MD  LORYNA 3-0.02 MG tablet TAKE 1 TABLET BY MOUTH EVERY DAY 11/23/18   Patton Salles, MD  sertraline (ZOLOFT) 50 MG tablet TAKE 3 TABLETS BY MOUTH AT BEDTIME 01/20/19   Patton Salles, MD      Allergies    Patient has no known allergies.    Review of Systems   Review of Systems  Physical Exam Updated Vital Signs BP 113/62 (BP Location: Right Arm)   Pulse 70   Temp 98.5 F (36.9 C) (Oral)   Resp 16   Ht 5\' 4"  (1.626 m)   Wt 92.5 kg   LMP 03/17/2023 (Approximate)   SpO2 98%   BMI 35.00 kg/m  Physical Exam Constitutional:      General: She is not in acute distress. HENT:      Head: Normocephalic and atraumatic.  Eyes:     Conjunctiva/sclera: Conjunctivae normal.     Pupils: Pupils are equal, round, and reactive to light.  Cardiovascular:     Rate and Rhythm: Normal rate and regular rhythm.  Pulmonary:     Effort: Pulmonary effort is normal. No respiratory distress.  Musculoskeletal:     Comments: Red superficial skin abrasion and tenderness of the left lateral and posterior chest wall, with focal left lateral posterior rib tenderness. Abrasion extending down into the left lower abdominal wall, no pelvic instability or tenderness No significant spinal midline tenderness, mild paraspinal tenderness  Skin:    General: Skin is warm and dry.  Neurological:     General: No focal deficit present.     Mental Status: She is alert. Mental status is at baseline.  Psychiatric:        Mood and Affect: Mood normal.        Behavior: Behavior normal.     ED Results / Procedures / Treatments   Labs (all labs ordered are listed, but only abnormal results are displayed) Labs Reviewed  URINALYSIS, ROUTINE W REFLEX MICROSCOPIC    EKG None  Radiology DG Ribs Unilateral W/Chest Left  Result Date:  03/31/2023 CLINICAL DATA:  Rib pain EXAM: LEFT RIBS AND CHEST - 3+ VIEW COMPARISON:  None Available. FINDINGS: Bilateral lung fields are clear. Bilateral costophrenic angles are clear. Normal cardio-mediastinal silhouette. There is mildly displaced fracture of the lateral aspect of the left tenth rib. The soft tissues are within normal limits. IMPRESSION: Mildly displaced fracture of the lateral aspect of the left tenth rib. Electronically Signed   By: Jules Schick M.D.   On: 03/31/2023 15:23    Procedures Procedures    Medications Ordered in ED Medications  oxyCODONE-acetaminophen (PERCOCET/ROXICET) 5-325 MG per tablet 1 tablet (1 tablet Oral Given 03/31/23 1445)  ibuprofen (ADVIL) tablet 800 mg (800 mg Oral Given 03/31/23 1445)    ED Course/ Medical Decision Making/  A&P                              Medical Decision Making Amount and/or Complexity of Data Reviewed Labs: ordered. Radiology: ordered.  Risk Prescription drug management.   Patient is here with a mechanical fall and left-sided flank and chest wall injury.  Vitals within normal limits on arrival.  X-ray of the ribs ordered and personally viewed and interpreted, notable for left posterior rib fx  Clinically suspect this is likely nondisplaced rib fracture.  Patient was given pain medications as well as incentive spirometer training.  I reviewed the patient's UA, which was normal.  Overall my clinical suspicion for acute intra-abdominal splenic or kidney injury is relatively low.  The patient has maintained normal vital signs, no tachycardia, normal UA, no evidence of ecchymosis on abdominal exam, which is reassuring.  However, I explained to the patient and her husband that I cannot completely exclude any possible intraabdominal injury without further diagnostic workup including CT imaging, and they understand this. I discussed with the patient and her husband the option of trauma CT imaging at this time versus watchful waiting.  We engaged in shared medical decision making and have opted for watchful waiting, with return precautions discussed, which I think is reasonable.  Patient was advised to return to the ER if she has worsening abdominal pain, nausea and vomiting, lightheadedness, new tachycardia (she has an apple watch to track her HR, and we discussed her resting rate appears to be close to 70 bpm here), or any other emergent concerns.        Final Clinical Impression(s) / ED Diagnoses Final diagnoses:  Rib pain on left side  Fall, initial encounter  Contusion of left chest wall, initial encounter    Rx / DC Orders ED Discharge Orders          Ordered    oxyCODONE-acetaminophen (PERCOCET/ROXICET) 5-325 MG tablet  Every 8 hours PRN        03/31/23 1502    ibuprofen  (ADVIL) 800 MG tablet  Every 8 hours PRN        03/31/23 1502              Terald Sleeper, MD 03/31/23 [phone]

## 2023-03-31 NOTE — ED Triage Notes (Signed)
Pt arrived POV. States she fell in her garage 1 hour PTA. States she tripped over box. Denies LOC or head injury. States L side and back pain. No other concerns reported. Not on blood thinners

## 2023-10-03 ENCOUNTER — Other Ambulatory Visit: Payer: Self-pay | Admitting: Obstetrics and Gynecology

## 2023-10-03 DIAGNOSIS — Z0000 Encounter for general adult medical examination without abnormal findings: Secondary | ICD-10-CM

## 2023-10-07 ENCOUNTER — Ambulatory Visit
Admission: RE | Admit: 2023-10-07 | Discharge: 2023-10-07 | Disposition: A | Discharge status: Home / Self Care | Payer: No Typology Code available for payment source | Source: Ambulatory Visit | Attending: Obstetrics and Gynecology | Admitting: Obstetrics and Gynecology

## 2023-10-07 DIAGNOSIS — Z0000 Encounter for general adult medical examination without abnormal findings: Secondary | ICD-10-CM

## 2024-06-18 ENCOUNTER — Other Ambulatory Visit (HOSPITAL_COMMUNITY): Payer: Self-pay
